# Patient Record
Sex: Female | Born: 1985 | Race: White | Hispanic: No | Marital: Married | Smoking: Former smoker
Health system: Southern US, Community
[De-identification: ages and names within clinical notes are randomized; demographics above are authoritative.]

## PROBLEM LIST (undated history)

## (undated) ENCOUNTER — Inpatient Hospital Stay (HOSPITAL_COMMUNITY): Payer: Self-pay

## (undated) DIAGNOSIS — T4145XA Adverse effect of unspecified anesthetic, initial encounter: Secondary | ICD-10-CM

## (undated) DIAGNOSIS — N8300 Follicular cyst of ovary, unspecified side: Secondary | ICD-10-CM

## (undated) DIAGNOSIS — N3020 Other chronic cystitis without hematuria: Secondary | ICD-10-CM

## (undated) DIAGNOSIS — N3010 Interstitial cystitis (chronic) without hematuria: Secondary | ICD-10-CM

## (undated) DIAGNOSIS — R87610 Atypical squamous cells of undetermined significance on cytologic smear of cervix (ASC-US): Secondary | ICD-10-CM

## (undated) DIAGNOSIS — IMO0002 Reserved for concepts with insufficient information to code with codable children: Secondary | ICD-10-CM

## (undated) DIAGNOSIS — F419 Anxiety disorder, unspecified: Secondary | ICD-10-CM

## (undated) DIAGNOSIS — M5136 Other intervertebral disc degeneration, lumbar region: Secondary | ICD-10-CM

## (undated) DIAGNOSIS — A630 Anogenital (venereal) warts: Secondary | ICD-10-CM

## (undated) DIAGNOSIS — Z8742 Personal history of other diseases of the female genital tract: Secondary | ICD-10-CM

## (undated) DIAGNOSIS — R000 Tachycardia, unspecified: Secondary | ICD-10-CM

## (undated) DIAGNOSIS — N870 Mild cervical dysplasia: Secondary | ICD-10-CM

## (undated) DIAGNOSIS — M51369 Other intervertebral disc degeneration, lumbar region without mention of lumbar back pain or lower extremity pain: Secondary | ICD-10-CM

## (undated) DIAGNOSIS — E282 Polycystic ovarian syndrome: Secondary | ICD-10-CM

## (undated) DIAGNOSIS — K219 Gastro-esophageal reflux disease without esophagitis: Secondary | ICD-10-CM

## (undated) DIAGNOSIS — T8859XA Other complications of anesthesia, initial encounter: Secondary | ICD-10-CM

## (undated) DIAGNOSIS — IMO0001 Reserved for inherently not codable concepts without codable children: Secondary | ICD-10-CM

## (undated) DIAGNOSIS — R87619 Unspecified abnormal cytological findings in specimens from cervix uteri: Secondary | ICD-10-CM

## (undated) DIAGNOSIS — N643 Galactorrhea not associated with childbirth: Secondary | ICD-10-CM

## (undated) HISTORY — DX: Polycystic ovarian syndrome: E28.2

## (undated) HISTORY — DX: Galactorrhea not associated with childbirth: N64.3

## (undated) HISTORY — DX: Other intervertebral disc degeneration, lumbar region: M51.36

## (undated) HISTORY — DX: Atypical squamous cells of undetermined significance on cytologic smear of cervix (ASC-US): R87.610

## (undated) HISTORY — PX: BLADDER AUGMENTATION: SHX1233

## (undated) HISTORY — DX: Other chronic cystitis without hematuria: N30.20

## (undated) HISTORY — DX: Personal history of other diseases of the female genital tract: Z87.42

## (undated) HISTORY — PX: ABDOMINAL HYSTERECTOMY: SHX81

## (undated) HISTORY — DX: Mild cervical dysplasia: N87.0

## (undated) HISTORY — DX: Unspecified abnormal cytological findings in specimens from cervix uteri: R87.619

## (undated) HISTORY — DX: Follicular cyst of ovary, unspecified side: N83.00

## (undated) HISTORY — PX: COLPOSCOPY: SHX161

## (undated) HISTORY — PX: MYRINGOTOMY: SUR874

## (undated) HISTORY — DX: Reserved for concepts with insufficient information to code with codable children: IMO0002

## (undated) HISTORY — DX: Other intervertebral disc degeneration, lumbar region without mention of lumbar back pain or lower extremity pain: M51.369

## (undated) HISTORY — PX: WISDOM TOOTH EXTRACTION: SHX21

## (undated) HISTORY — PX: CHOLECYSTECTOMY: SHX55

## (undated) HISTORY — DX: Anogenital (venereal) warts: A63.0

---
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
## 2005-04-13 ENCOUNTER — Inpatient Hospital Stay (HOSPITAL_COMMUNITY): Admission: AD | Admit: 2005-04-13 | Discharge: 2005-04-13 | Payer: Self-pay | Admitting: Obstetrics & Gynecology

## 2005-06-18 ENCOUNTER — Inpatient Hospital Stay (HOSPITAL_COMMUNITY): Admission: AD | Admit: 2005-06-18 | Discharge: 2005-06-18 | Payer: Self-pay | Admitting: Obstetrics and Gynecology

## 2005-06-19 ENCOUNTER — Inpatient Hospital Stay (HOSPITAL_COMMUNITY): Admission: AD | Admit: 2005-06-19 | Discharge: 2005-06-19 | Payer: Self-pay | Admitting: *Deleted

## 2005-08-20 ENCOUNTER — Inpatient Hospital Stay (HOSPITAL_COMMUNITY): Admission: AD | Admit: 2005-08-20 | Discharge: 2005-08-20 | Payer: Self-pay | Admitting: *Deleted

## 2005-08-23 ENCOUNTER — Inpatient Hospital Stay (HOSPITAL_COMMUNITY): Admission: AD | Admit: 2005-08-23 | Discharge: 2005-08-25 | Payer: Self-pay | Admitting: Obstetrics and Gynecology

## 2006-06-05 ENCOUNTER — Inpatient Hospital Stay (HOSPITAL_COMMUNITY): Admission: AD | Admit: 2006-06-05 | Discharge: 2006-06-06 | Payer: Self-pay | Admitting: *Deleted

## 2006-09-20 ENCOUNTER — Ambulatory Visit: Payer: Self-pay | Admitting: Internal Medicine

## 2006-09-20 DIAGNOSIS — N3010 Interstitial cystitis (chronic) without hematuria: Secondary | ICD-10-CM

## 2006-09-20 DIAGNOSIS — F17200 Nicotine dependence, unspecified, uncomplicated: Secondary | ICD-10-CM

## 2006-09-20 DIAGNOSIS — R519 Headache, unspecified: Secondary | ICD-10-CM | POA: Insufficient documentation

## 2006-09-20 DIAGNOSIS — Z9189 Other specified personal risk factors, not elsewhere classified: Secondary | ICD-10-CM | POA: Insufficient documentation

## 2006-09-20 DIAGNOSIS — R109 Unspecified abdominal pain: Secondary | ICD-10-CM | POA: Insufficient documentation

## 2006-09-20 DIAGNOSIS — R51 Headache: Secondary | ICD-10-CM | POA: Insufficient documentation

## 2006-09-20 LAB — CONVERTED CEMR LAB
AST: 16 units/L (ref 0–37)
Basophils Absolute: 0 10*3/uL (ref 0.0–0.1)
Bilirubin, Direct: 0.1 mg/dL (ref 0.0–0.3)
CO2: 29 meq/L (ref 19–32)
Calcium: 9.4 mg/dL (ref 8.4–10.5)
Eosinophils Absolute: 0.2 10*3/uL (ref 0.0–0.6)
GFR calc Af Amer: 162 mL/min
Glucose, Bld: 74 mg/dL (ref 70–99)
H Pylori IgG: NEGATIVE
Lymphocytes Relative: 25.2 % (ref 12.0–46.0)
MCV: 83.9 fL (ref 78.0–100.0)
Neutro Abs: 3.8 10*3/uL (ref 1.4–7.7)
Platelets: 252 10*3/uL (ref 150–400)
Potassium: 4.1 meq/L (ref 3.5–5.1)
RBC: 4.77 M/uL (ref 3.87–5.11)

## 2006-09-28 ENCOUNTER — Encounter: Payer: Self-pay | Admitting: Internal Medicine

## 2006-09-28 LAB — CONVERTED CEMR LAB: Tissue Transglutaminase Ab, IgA: 0.6 units (ref <7)

## 2006-10-04 ENCOUNTER — Encounter: Payer: Self-pay | Admitting: Internal Medicine

## 2007-05-18 ENCOUNTER — Ambulatory Visit (HOSPITAL_COMMUNITY): Admission: RE | Admit: 2007-05-18 | Discharge: 2007-05-18 | Payer: Self-pay | Admitting: Internal Medicine

## 2007-11-09 ENCOUNTER — Emergency Department (HOSPITAL_BASED_OUTPATIENT_CLINIC_OR_DEPARTMENT_OTHER): Admission: EM | Admit: 2007-11-09 | Discharge: 2007-11-09 | Payer: Self-pay | Admitting: Emergency Medicine

## 2009-11-21 ENCOUNTER — Emergency Department (HOSPITAL_BASED_OUTPATIENT_CLINIC_OR_DEPARTMENT_OTHER)
Admission: EM | Admit: 2009-11-21 | Discharge: 2009-11-21 | Payer: Self-pay | Source: Home / Self Care | Admitting: Emergency Medicine

## 2010-02-15 ENCOUNTER — Emergency Department (HOSPITAL_BASED_OUTPATIENT_CLINIC_OR_DEPARTMENT_OTHER)
Admission: EM | Admit: 2010-02-15 | Discharge: 2010-02-15 | Disposition: A | Discharge status: Home / Self Care | Payer: Federal, State, Local not specified - PPO | Attending: Emergency Medicine | Admitting: Emergency Medicine

## 2010-02-15 DIAGNOSIS — R599 Enlarged lymph nodes, unspecified: Secondary | ICD-10-CM | POA: Insufficient documentation

## 2010-02-15 DIAGNOSIS — R220 Localized swelling, mass and lump, head: Secondary | ICD-10-CM | POA: Insufficient documentation

## 2010-02-15 DIAGNOSIS — R221 Localized swelling, mass and lump, neck: Secondary | ICD-10-CM | POA: Insufficient documentation

## 2010-02-15 DIAGNOSIS — F17200 Nicotine dependence, unspecified, uncomplicated: Secondary | ICD-10-CM | POA: Insufficient documentation

## 2010-02-15 LAB — CBC: MCH: 29 pg (ref 26.0–34.0)

## 2010-02-15 LAB — BASIC METABOLIC PANEL
BUN: 7 mg/dL (ref 6–23)
Chloride: 106 mEq/L (ref 96–112)
Creatinine, Ser: 0.6 mg/dL (ref 0.4–1.2)
GFR calc non Af Amer: >60 mL/min (ref >60)
Glucose, Bld: 94 mg/dL (ref 70–99)
Potassium: 4.2 mEq/L (ref 3.5–5.1)

## 2010-02-15 LAB — DIFFERENTIAL
Basophils Absolute: 0 10*3/uL (ref 0.0–0.1)
Monocytes Absolute: 0.5 10*3/uL (ref 0.1–1.0)
Monocytes Relative: 6 % (ref 3–12)
Neutro Abs: 5 10*3/uL (ref 1.7–7.7)

## 2010-02-15 LAB — URINALYSIS, ROUTINE W REFLEX MICROSCOPIC
Bilirubin Urine: NEGATIVE
Ketones, ur: NEGATIVE mg/dL
Nitrite: NEGATIVE
Protein, ur: NEGATIVE mg/dL
Specific Gravity, Urine: 1.005 (ref 1.005–1.030)

## 2010-02-15 LAB — PREGNANCY, URINE: Preg Test, Ur: NEGATIVE

## 2010-05-23 NOTE — H&P (Signed)
NAMEKAMILLA, HANDS             ACCOUNT NO.:  [id number]   MEDICAL RECORD NO.:  [id number]          PATIENT TYPE:  INP   LOCATION:  9169                          FACILITY:  [facility]   PHYSICIAN:  Lenoard Aden, M.D.DATE OF BIRTH:  [birth year]   DATE OF ADMISSION:  08/23/2005  DATE OF DISCHARGE:                                HISTORY & PHYSICAL   CHIEF COMPLAINT:  Labor.   She is a 25 year old white female G1, P0, EDD is August 20, 2005, at 40-3/7  weeks, presents in active labor.  SHE HAS ALLERGY TO SULFA.   MEDICATIONS:  Prenatal vitamins.   She is a nonsmoker, nondrinker.  She denies domestic or physical violence.   MEDICAL HISTORY:  Remarkable for:  1. Asthma.  2. HPV.   FAMILY HISTORY:  1. Myocardial infarction.  2. Anemia,  3. Diabetes.  4. Thyroid disease.  5. Depression.   PREGNANCY COURSE:  Was complicated by preterm labor with negative  surveillance noted and normal ultrasounds by report.   PHYSICAL EXAM:  She is a well-developed, well-nourished white female in no  acute distress.  HEENT:  Normal.  LUNGS:  Clear.  HEART:  Rhythm regular.  ABDOMEN:  Soft, gravida, nontender.  Estimated fetal weight 7 pounds.  CERVIX:  Is 6-7 cm, 100%, vertex 0.  EXTREMITIES:  __________.  NEUROLOGIC EXAM:  Nonfocal.   IMPRESSION:  Term intrauterine pregnancy in active labor.   PLAN:  Proceed with admission and routine orders.      Lenoard Aden, M.D.  Electronically Signed     RJT/MEDQ  D:  08/23/2005  T:  08/23/2005  Job:  [id number]

## 2010-05-23 NOTE — Op Note (Signed)
[name], [name]             ACCOUNT NO.:  [id number]   MEDICAL RECORD NO.:  [id number]          PATIENT TYPE:  AMB   LOCATION:  [facility]                         FACILITY:  [facility]   PHYSICIAN:  Jamison Neighbor, M.D.  DATE OF BIRTH:  April 27, 1985   DATE OF PROCEDURE:  03/07/2004  DATE OF DISCHARGE:                                 OPERATIVE REPORT   PREOPERATIVE DIAGNOSIS:  Chronic pelvic pain, rule out interstitial  cystitis.   POSTOPERATIVE DIAGNOSIS:  Chronic pelvic pain, rule out interstitial  cystitis.   OPERATION/PROCEDURE:  1.  Cystoscopy.  2.  Urethral calibration.  3.  Hydrodistention of the bladder.  4.  Bladder biopsy with cauterization.  5.  Marcaine and Pyridium instillation, Marcaine and Kenalog injections.   SURGEON:  Jamison Neighbor, M.D.   ANESTHESIA:  General.   COMPLICATIONS:  None.   DRAINS:  None.   BRIEF HISTORY:  This 25 year old female has chronic pelvic pain.  The  etiology remains unclear but there is some suspicion that she might have  interstitial cystitis.  In addition, the patient does have some signs and  symptoms possibly consistent with endometriosis.  The patient's bladder  symptoms include urgency, frequency, dysuria, and chronic pelvic pain.  It  is certainly felt on the basis of preliminary evaluation that she might have  interstitial cystitis.  The patient is now to undergo cystoscopy, urethral  calibration and hydrodistention of the bladder with bladder biopsy if  indicated.  She will undergo Marcaine and Pyridium instillation and Marcaine  and Kenalog injection.  She understands the risks and benefits of the  procedure and gave full and informed consent.  This will be done  simultaneously with diagnostic laparoscopy by Dr. Delia Heady.   DESCRIPTION OF PROCEDURE:  After successful induction of general anesthesia,  the patient was placed in the dorsal lithotomy position, prepped with  Betadine and draped in the usual sterile fashion.   Careful bimanual  examination revealed a completely normal pelvis with no signs of cystocele,  rectocele or enterocele.  The uterus was palpably normal.  The urethra was  unremarkable.  The bladder appeared to be in good position.  The urethra was  of normal size accepting a 32-French ___________ urethral sound with no sign  of stenosis or stricture.  The cystoscope was inserted and the bladder was  carefully inspected.  It was free of any tumor or stones.  Both ureteral  orifices were normal in configuration and location.  The bladder was  distended at a pressure of 100 H2Ocm for five minutes when the bladder was  drained.  Glomerulations can be seen throughout the bladder.  The bladder  capacity was just under 900 mL which is slightly diminished compared to a  normal bladder capacity of 1150, but it is certainly better than the average  for interstitial cystitis which is 575 mL.  There was a terminal blood tinge  to the drain out.  This was felt to be consistent with a mild to moderate or  early interstitial cystitis.  A bladder biopsy will be sent for mass cell  analysis.  The biopsy site was cauterized as was a little bit of squamous  metaplasia at the base of the bladder.  The bladder  was drained.  A  Marcaine and Pyridium were left in the bladder.  Marcaine and Kenalog were  injected periurethrally.  The patient tolerated the procedure well and was  taken to the recovery room in good condition.   The patient did undergo diagnostic laparoscopy by Dr. Earlene Plater and who will  dictate that portion of the procedure.      RJE/MEDQ  D:  03/07/2004  T:  03/07/2004  Job:  [id number]

## 2010-05-23 NOTE — Op Note (Signed)
[name], [name]             ACCOUNT NO.:  [id number]   MEDICAL RECORD NO.:  [id number]          PATIENT TYPE:  AMB   LOCATION:  [facility]                         FACILITY:  [facility]   PHYSICIAN:  Ulen B. Earlene Plater, M.D.  DATE OF BIRTH:  [birth year]   DATE OF PROCEDURE:  03/07/2004  DATE OF DISCHARGE:                                 OPERATIVE REPORT   PREOPERATIVE DIAGNOSIS:  Pelvic pain, dysmenorrhea, suspected interstitial  cystitis.   POSTOPERATIVE DIAGNOSIS:  Pelvic pain, dysmenorrhea, suspected interstitial  cystitis.   PROCEDURE:  Open diagnostic laparoscopy following Dr. Logan Bores' cystoscopy.   SURGEON:  Chester Holstein. Earlene Plater, M.D.   ANESTHESIA:  General.   FINDINGS:  Normal-appearing pelvis, appendix, upper abdomen, tubes, and  ovaries.   BLOOD LOSS:  Less than 50.   COMPLICATIONS:  None.   SPECIMENS:  None.   INDICATIONS:  Patient with a history of pelvic pain, painful periods, and  irritative voiding symptoms.  Suspicion clinically for interstitial  cystitis.  Given the possibility for coexisting endometriosis, I recommended  diagnostic laparoscopy after Dr. Logan Bores did his cystoscopy.  We discussed the  risk of surgery, including bleeding, infection, damage to surrounding  organs.   After Dr. Logan Bores was complete with his procedure, the patient was reprepped  and draped.  The bladder had just been emptied at the end of Dr. Logan Bores'  cystoscopy.  The speculum inserted.  A single tooth attached to the anterior  lip of the cervix, and Hulka tenaculum attached.  A transverse incision was  made in the umbilicus, carried sharply to the fascia.  The fascia was  elevated and entered sharply.  The posterior sheath and peritoneum were  entered sharply with elevation of the fascia.  Purse-string suture of 0  Vicryl placed around the fascial defect.  A 5 mm trocar sleeve was inserted  without the blade with a blunt probe inserted.  This was secured with the  purse-string suture.   Pneumoperitoneum obtained with CO2 gas.  The  Trendelenburg position was obtained, and diagnostic laparoscopy 5 mm camera  was inserted.  The pelvis was inspected and appeared completely normal, as  did the upper abdomen; therefore, the diagnostic laparoscopy was terminated.   The scope was removed, and gas released.  The trocar was removed, and any  residual gas removed with elevation of the abdominal wall with an S  retractor.  The fascia defect was obliterated by tying the previously placed  purse-string suture.  The skin was closed with 4-0 Vicryl in a subcuticular  fashion.  The single-tooth tenaculum removed and cervix hemostatic.   The patient tolerated the procedure well.  There were no complications.  She  was taken to the recovery room awake and alert in a stable condition.      WBD/MEDQ  D:  03/07/2004  T:  03/07/2004  Job:  [id number]

## 2010-06-17 LAB — RPR: RPR: NONREACTIVE

## 2010-06-17 LAB — HIV ANTIBODY (ROUTINE TESTING W REFLEX): HIV: NONREACTIVE

## 2010-06-17 LAB — ABO/RH: RH Type: NEGATIVE

## 2010-06-20 ENCOUNTER — Emergency Department (HOSPITAL_BASED_OUTPATIENT_CLINIC_OR_DEPARTMENT_OTHER)
Admission: EM | Admit: 2010-06-20 | Discharge: 2010-06-21 | Disposition: A | Discharge status: Home / Self Care | Payer: Federal, State, Local not specified - PPO | Attending: Emergency Medicine | Admitting: Emergency Medicine

## 2010-06-20 DIAGNOSIS — R079 Chest pain, unspecified: Secondary | ICD-10-CM | POA: Insufficient documentation

## 2010-06-20 DIAGNOSIS — R0609 Other forms of dyspnea: Secondary | ICD-10-CM | POA: Insufficient documentation

## 2010-06-20 DIAGNOSIS — F17200 Nicotine dependence, unspecified, uncomplicated: Secondary | ICD-10-CM | POA: Insufficient documentation

## 2010-06-20 DIAGNOSIS — R110 Nausea: Secondary | ICD-10-CM | POA: Insufficient documentation

## 2010-06-20 DIAGNOSIS — L272 Dermatitis due to ingested food: Secondary | ICD-10-CM | POA: Insufficient documentation

## 2010-06-20 DIAGNOSIS — R0989 Other specified symptoms and signs involving the circulatory and respiratory systems: Secondary | ICD-10-CM | POA: Insufficient documentation

## 2010-09-15 ENCOUNTER — Emergency Department (HOSPITAL_BASED_OUTPATIENT_CLINIC_OR_DEPARTMENT_OTHER)
Admission: EM | Admit: 2010-09-15 | Discharge: 2010-09-15 | Discharge status: Against Medical Advice | Payer: Federal, State, Local not specified - PPO | Attending: Emergency Medicine | Admitting: Emergency Medicine

## 2010-09-15 DIAGNOSIS — J029 Acute pharyngitis, unspecified: Secondary | ICD-10-CM | POA: Insufficient documentation

## 2010-09-15 HISTORY — DX: Interstitial cystitis (chronic) without hematuria: N30.10

## 2010-09-15 NOTE — ED Notes (Signed)
C/o sorethroat x 23 days-[redacted] weeks pregnant

## 2010-10-07 LAB — URINALYSIS, ROUTINE W REFLEX MICROSCOPIC
Bilirubin Urine: NEGATIVE
Glucose, UA: NEGATIVE
Ketones, ur: 15 — AB
Nitrite: NEGATIVE
pH: 6

## 2010-10-07 LAB — COMPREHENSIVE METABOLIC PANEL
ALT: 8
Albumin: 4.8
Calcium: 9.6
GFR calc Af Amer: >60
GFR calc non Af Amer: >60
Sodium: 139
Total Protein: 7.9

## 2010-10-07 LAB — URINE MICROSCOPIC-ADD ON

## 2010-10-07 LAB — DIFFERENTIAL
Basophils Relative: 2 — ABNORMAL HIGH
Eosinophils Absolute: 0.1
Eosinophils Relative: 2
Monocytes Relative: 5

## 2010-10-07 LAB — CBC
HCT: 43.1
Hemoglobin: 14.8
Platelets: 212
RDW: 11.9

## 2011-01-06 NOTE — L&D Delivery Note (Signed)
Delivery Note At 1:35 PM a viable and healthy female was delivered via  (Presentation:LOA).  APGAR: 8, 9; weight pending.   Placenta status: spontaneous and intact with 3 vessel Cord:  with the following complications: none.  Cord pH: na  Anesthesia: Epidural  Episiotomy: none Lacerations: 1st degree Suture Repair: 3.0 vicryl rapide Est. Blood Loss (mL): 300  Mom to postpartum.  Baby to nursery-stable.  [name] J 03/09/2011, 1:45 PM

## 2011-01-10 ENCOUNTER — Encounter (HOSPITAL_COMMUNITY): Payer: Self-pay | Admitting: *Deleted

## 2011-01-10 ENCOUNTER — Inpatient Hospital Stay (HOSPITAL_COMMUNITY)
Admission: AD | Admit: 2011-01-10 | Discharge: 2011-01-10 | Disposition: A | Discharge status: Home / Self Care | Payer: Federal, State, Local not specified - PPO | Source: Ambulatory Visit | Attending: Obstetrics | Admitting: Obstetrics

## 2011-01-10 DIAGNOSIS — R109 Unspecified abdominal pain: Secondary | ICD-10-CM | POA: Insufficient documentation

## 2011-01-10 DIAGNOSIS — O4700 False labor before 37 completed weeks of gestation, unspecified trimester: Secondary | ICD-10-CM | POA: Insufficient documentation

## 2011-01-10 DIAGNOSIS — R319 Hematuria, unspecified: Secondary | ICD-10-CM | POA: Insufficient documentation

## 2011-01-10 HISTORY — DX: Interstitial cystitis (chronic) without hematuria: N30.10

## 2011-01-10 LAB — WET PREP, GENITAL: Clue Cells Wet Prep HPF POC: NONE SEEN

## 2011-01-10 LAB — URINALYSIS, ROUTINE W REFLEX MICROSCOPIC
Ketones, ur: NEGATIVE mg/dL
Nitrite: NEGATIVE
Protein, ur: NEGATIVE mg/dL
pH: 7 (ref 5.0–8.0)

## 2011-01-10 LAB — URINE MICROSCOPIC-ADD ON

## 2011-01-10 MED ORDER — NITROFURANTOIN MONOHYD MACRO 100 MG PO CAPS: 100.0000 mg | ORAL_CAPSULE | Freq: Two times a day (BID) | ORAL | Status: AC

## 2011-01-10 MED ORDER — RISAQUAD PO CAPS: 2.0000 | ORAL_CAPSULE | Freq: Every day | ORAL | Status: DC

## 2011-01-10 NOTE — ED Provider Notes (Signed)
History   [name] is a 26 y.o. female G68P1001 [redacted]w[redacted]d with c/o increased cramping and vaginal discharge this AM. PNC at [facility] since 1st trim. Pregnancy complicated by PTC, repeated fFN 2 weeks ago negative, cvx closed last visit. Reports good FM, VD described as white thin, denies VB / odor / itching. No IC in past 24 hrs.  Hx of PTC with first pregnancy but term SVD.   Chief Complaint  Patient presents with  . Vaginal Discharge  . Flank Pain  . Urinary Frequency     OB History    Grav Para Term Preterm Abortions TAB SAB Ect Mult Living   2 1 1  0 0 0 0 0 0 1      Past Medical History  Diagnosis Date  . Cystitis, interstitial   . Cystitides, interstitial, chronic     Past Surgical History  Procedure Date  . Bladder augmentation     No family history on file.  History  Substance Use Topics  . Smoking status: Former Games developer  . Smokeless tobacco: Not on file  . Alcohol Use: No    Allergies:  Allergies  Allergen Reactions  . Dilaudid (Hydromorphone Hcl) Palpitations  . Iodine Rash  . Sulfonamide Derivatives Rash    Prescriptions prior to admission  Medication Sig Dispense Refill  . acetaminophen (TYLENOL) 325 MG tablet Take 650 mg by mouth once. For pain        . alum & mag hydroxide-simeth (MAALOX/MYLANTA) 200-200-20 MG/5ML suspension Take 5 mLs by mouth as needed. For indigestion       . calcium carbonate (TUMS - DOSED IN MG ELEMENTAL CALCIUM) 500 MG chewable tablet Chew 2 tablets by mouth 2 (two) times daily as needed. For indigestion         ROS neg except as noted  Physical Exam    Filed Vitals:   01/10/11 1231  BP: 108/74  Pulse: 95  Temp: 98.6 F (37 C)  TempSrc: Oral  Resp: 18  Height: 5\' 3"  (1.6 m)  Weight: 70.308 kg (155 lb)    Physical Exam: General: NAD Heart: RRR, no murmurs Lungs: CTA b/l  Abd: Soft, NT,  Ext: no edema Neuro: DTRs normal    Membranes:intact Vaginal bleeding: none Speculum Exam: cvx appears closed, wet  prep and fFN collected Digital Cervical Exam: Dilatation exterior os FT, interior os closed   Effacement 0   FHR:  Baseline rate 140   Variability moderate  Accelerations present  Decelerations none Contractions: Frequency X 2 in 60 min, mild irritability in between   ED Course   Results for orders placed during the hospital encounter of 01/10/11 (from the past 24 hour(s))  URINALYSIS, ROUTINE W REFLEX MICROSCOPIC     Status: Abnormal   Collection Time   01/10/11 12:35 PM      Component Value Range   Color, Urine YELLOW  YELLOW    APPearance CLEAR  CLEAR    Specific Gravity, Urine <1.005 (*) 1.005 - 1.030    pH 7.0  5.0 - 8.0    Glucose, UA NEGATIVE  NEGATIVE (mg/dL)   Hgb urine dipstick TRACE (*) NEGATIVE    Bilirubin Urine NEGATIVE  NEGATIVE    Ketones, ur NEGATIVE  NEGATIVE (mg/dL)   Protein, ur NEGATIVE  NEGATIVE (mg/dL)   Urobilinogen, UA 0.2  0.0 - 1.0 (mg/dL)   Nitrite NEGATIVE  NEGATIVE    Leukocytes, UA LARGE (*) NEGATIVE   URINE MICROSCOPIC-ADD ON     Status:  Abnormal   Collection Time   01/10/11 12:35 PM      Component Value Range   Squamous Epithelial / LPF MANY (*) RARE    WBC, UA 11-20  <3 (WBC/hpf)   RBC / HPF 0-2  <3 (RBC/hpf)   Bacteria, UA MANY (*) RARE   WET PREP, GENITAL     Status: Abnormal   Collection Time   01/10/11  1:15 PM      Component Value Range   Yeast, Wet Prep NONE SEEN  NONE SEEN    Trich, Wet Prep NONE SEEN  NONE SEEN    Clue Cells, Wet Prep NONE SEEN  NONE SEEN    WBC, Wet Prep HPF POC MODERATE (*) NONE SEEN     A/P: IUP at 30w 5d Preterm contractions No progressive cervical change, FfN pending Hematuria, suspect UTI / cystitis, will start Macrobid 100 mg PO BID pending UCX D/c home w/ PTL precautions, call if ctx > 6 / hr after rest and hydration F/U with OV as scheduled.   Dr. Billy Coast updated  [facility] 01/10/2011 2:14 PM

## 2011-01-11 LAB — URINE CULTURE: Colony Count: 1000

## 2011-02-10 LAB — STREP B DNA PROBE: GBS: NEGATIVE

## 2011-02-24 ENCOUNTER — Telehealth (HOSPITAL_COMMUNITY): Payer: Self-pay | Admitting: *Deleted

## 2011-02-24 ENCOUNTER — Encounter (HOSPITAL_COMMUNITY): Payer: Self-pay | Admitting: *Deleted

## 2011-02-24 NOTE — Telephone Encounter (Signed)
Preadmission screen  

## 2011-02-26 ENCOUNTER — Other Ambulatory Visit: Payer: Self-pay | Admitting: Obstetrics and Gynecology

## 2011-02-28 ENCOUNTER — Encounter (HOSPITAL_COMMUNITY): Payer: Self-pay | Admitting: *Deleted

## 2011-02-28 ENCOUNTER — Inpatient Hospital Stay (HOSPITAL_COMMUNITY)
Admission: AD | Admit: 2011-02-28 | Discharge: 2011-02-28 | Disposition: A | Discharge status: Home Health | Payer: Self-pay | Source: Ambulatory Visit | Attending: Obstetrics and Gynecology | Admitting: Obstetrics and Gynecology

## 2011-02-28 DIAGNOSIS — O479 False labor, unspecified: Secondary | ICD-10-CM | POA: Insufficient documentation

## 2011-02-28 NOTE — ED Notes (Signed)
Pt given options of ambulating or going home.  States will go home and return when ctx stronger.

## 2011-03-09 ENCOUNTER — Encounter (HOSPITAL_COMMUNITY): Payer: Self-pay | Admitting: Anesthesiology

## 2011-03-09 ENCOUNTER — Inpatient Hospital Stay (HOSPITAL_COMMUNITY)
Admission: RE | Admit: 2011-03-09 | Discharge: 2011-03-10 | DRG: 373 | Disposition: A | Discharge status: Home / Self Care | Payer: Federal, State, Local not specified - PPO | Source: Ambulatory Visit | Attending: Obstetrics and Gynecology | Admitting: Obstetrics and Gynecology

## 2011-03-09 ENCOUNTER — Encounter (HOSPITAL_COMMUNITY): Payer: Self-pay

## 2011-03-09 ENCOUNTER — Inpatient Hospital Stay (HOSPITAL_COMMUNITY): Payer: Federal, State, Local not specified - PPO | Admitting: Anesthesiology

## 2011-03-09 DIAGNOSIS — IMO0001 Reserved for inherently not codable concepts without codable children: Secondary | ICD-10-CM

## 2011-03-09 HISTORY — DX: Reserved for inherently not codable concepts without codable children: IMO0001

## 2011-03-09 LAB — CBC
HCT: 35.6 % — ABNORMAL LOW (ref 36.0–46.0)
MCV: 86 fL (ref 78.0–100.0)
Platelets: 281 10*3/uL (ref 150–400)
RBC: 4.14 MIL/uL (ref 3.87–5.11)
WBC: 14 10*3/uL — ABNORMAL HIGH (ref 4.0–10.5)

## 2011-03-09 MED ORDER — ACETAMINOPHEN 325 MG PO TABS: 650.0000 mg | ORAL_TABLET | ORAL | Status: DC | PRN

## 2011-03-09 MED ORDER — SENNOSIDES-DOCUSATE SODIUM 8.6-50 MG PO TABS
2.0000 | ORAL_TABLET | Freq: Every day | ORAL | Status: DC
  Administered 2011-03-09: 2 via ORAL

## 2011-03-09 MED ORDER — DIBUCAINE 1 % RE OINT: 1.0000 "application " | TOPICAL_OINTMENT | RECTAL | Status: DC | PRN

## 2011-03-09 MED ORDER — IBUPROFEN 600 MG PO TABS
600.0000 mg | ORAL_TABLET | Freq: Four times a day (QID) | ORAL | Status: DC
  Administered 2011-03-09 – 2011-03-10 (×4): 600 mg via ORAL
  Filled 2011-03-09 (×4): qty 1

## 2011-03-09 MED ORDER — LACTATED RINGERS IV SOLN
500.0000 mL | Freq: Once | INTRAVENOUS | Status: AC
  Administered 2011-03-09: 1000 mL via INTRAVENOUS

## 2011-03-09 MED ORDER — DIPHENHYDRAMINE HCL 25 MG PO CAPS: 25.0000 mg | ORAL_CAPSULE | Freq: Four times a day (QID) | ORAL | Status: DC | PRN

## 2011-03-09 MED ORDER — METHYLERGONOVINE MALEATE 0.2 MG PO TABS: 0.2000 mg | ORAL_TABLET | ORAL | Status: DC | PRN

## 2011-03-09 MED ORDER — MISOPROSTOL 200 MCG PO TABS
ORAL_TABLET | ORAL | Status: AC
  Filled 2011-03-09: qty 5

## 2011-03-09 MED ORDER — OXYTOCIN 20 UNITS IN LACTATED RINGERS INFUSION - SIMPLE: 125.0000 mL/h | Freq: Once | INTRAVENOUS | Status: DC

## 2011-03-09 MED ORDER — TERBUTALINE SULFATE 1 MG/ML IJ SOLN: 0.2500 mg | Freq: Once | INTRAMUSCULAR | Status: DC | PRN

## 2011-03-09 MED ORDER — IBUPROFEN 600 MG PO TABS: 600.0000 mg | ORAL_TABLET | Freq: Four times a day (QID) | ORAL | Status: DC | PRN

## 2011-03-09 MED ORDER — BENZOCAINE-MENTHOL 20-0.5 % EX AERO
INHALATION_SPRAY | CUTANEOUS | Status: AC
  Administered 2011-03-10: 1 via TOPICAL
  Filled 2011-03-09: qty 56

## 2011-03-09 MED ORDER — LANOLIN HYDROUS EX OINT: TOPICAL_OINTMENT | CUTANEOUS | Status: DC | PRN

## 2011-03-09 MED ORDER — LACTATED RINGERS IV SOLN: 500.0000 mL | INTRAVENOUS | Status: DC | PRN

## 2011-03-09 MED ORDER — FLEET ENEMA 7-19 GM/118ML RE ENEM: 1.0000 | ENEMA | RECTAL | Status: DC | PRN

## 2011-03-09 MED ORDER — LACTATED RINGERS IV SOLN
INTRAVENOUS | Status: DC
  Administered 2011-03-09: 10:00:00 via INTRAVENOUS

## 2011-03-09 MED ORDER — BENZOCAINE-MENTHOL 20-0.5 % EX AERO
1.0000 "application " | INHALATION_SPRAY | CUTANEOUS | Status: DC | PRN
  Administered 2011-03-09 – 2011-03-10 (×2): 1 via TOPICAL

## 2011-03-09 MED ORDER — FENTANYL 2.5 MCG/ML BUPIVACAINE 1/10 % EPIDURAL INFUSION (WH - ANES)
14.0000 mL/h | INTRAMUSCULAR | Status: DC
  Administered 2011-03-09 (×2): 14 mL/h via EPIDURAL
  Filled 2011-03-09 (×2): qty 60

## 2011-03-09 MED ORDER — ONDANSETRON HCL 4 MG/2ML IJ SOLN: 4.0000 mg | INTRAMUSCULAR | Status: DC | PRN

## 2011-03-09 MED ORDER — LIDOCAINE HCL (PF) 1 % IJ SOLN
INTRAMUSCULAR | Status: DC | PRN
  Administered 2011-03-09 (×2): 5 mL

## 2011-03-09 MED ORDER — OXYTOCIN 10 UNIT/ML IJ SOLN
INTRAMUSCULAR | Status: AC
  Administered 2011-03-09: 10 [IU] via INTRAVENOUS
  Filled 2011-03-09: qty 2

## 2011-03-09 MED ORDER — OXYTOCIN 20 UNITS IN LACTATED RINGERS INFUSION - SIMPLE
1.0000 m[IU]/min | INTRAVENOUS | Status: DC
  Administered 2011-03-09: 2 m[IU]/min via INTRAVENOUS
  Filled 2011-03-09: qty 1000

## 2011-03-09 MED ORDER — PHENYLEPHRINE 40 MCG/ML (10ML) SYRINGE FOR IV PUSH (FOR BLOOD PRESSURE SUPPORT)
80.0000 ug | PREFILLED_SYRINGE | INTRAVENOUS | Status: DC | PRN
  Filled 2011-03-09: qty 5

## 2011-03-09 MED ORDER — METHYLERGONOVINE MALEATE 0.2 MG/ML IJ SOLN
INTRAMUSCULAR | Status: AC
  Filled 2011-03-09: qty 1

## 2011-03-09 MED ORDER — ZOLPIDEM TARTRATE 5 MG PO TABS: 5.0000 mg | ORAL_TABLET | Freq: Every evening | ORAL | Status: DC | PRN

## 2011-03-09 MED ORDER — EPHEDRINE 5 MG/ML INJ
10.0000 mg | INTRAVENOUS | Status: DC | PRN
  Filled 2011-03-09: qty 4

## 2011-03-09 MED ORDER — LIDOCAINE HCL (PF) 1 % IJ SOLN: 30.0000 mL | INTRAMUSCULAR | Status: DC | PRN

## 2011-03-09 MED ORDER — ONDANSETRON HCL 4 MG PO TABS: 4.0000 mg | ORAL_TABLET | ORAL | Status: DC | PRN

## 2011-03-09 MED ORDER — WITCH HAZEL-GLYCERIN EX PADS: 1.0000 "application " | MEDICATED_PAD | CUTANEOUS | Status: DC | PRN

## 2011-03-09 MED ORDER — OXYTOCIN BOLUS FROM INFUSION
500.0000 mL | Freq: Once | INTRAVENOUS | Status: DC
  Filled 2011-03-09: qty 500

## 2011-03-09 MED ORDER — ONDANSETRON HCL 4 MG/2ML IJ SOLN: 4.0000 mg | Freq: Four times a day (QID) | INTRAMUSCULAR | Status: DC | PRN

## 2011-03-09 MED ORDER — OXYCODONE-ACETAMINOPHEN 5-325 MG PO TABS: 1.0000 | ORAL_TABLET | ORAL | Status: DC | PRN

## 2011-03-09 MED ORDER — METHYLERGONOVINE MALEATE 0.2 MG/ML IJ SOLN: 0.2000 mg | INTRAMUSCULAR | Status: DC | PRN

## 2011-03-09 MED ORDER — DIPHENHYDRAMINE HCL 50 MG/ML IJ SOLN
12.5000 mg | INTRAMUSCULAR | Status: DC | PRN
  Filled 2011-03-09: qty 1

## 2011-03-09 MED ORDER — PHENYLEPHRINE 40 MCG/ML (10ML) SYRINGE FOR IV PUSH (FOR BLOOD PRESSURE SUPPORT): 80.0000 ug | PREFILLED_SYRINGE | INTRAVENOUS | Status: DC | PRN

## 2011-03-09 MED ORDER — EPHEDRINE 5 MG/ML INJ: 10.0000 mg | INTRAVENOUS | Status: DC | PRN

## 2011-03-09 MED ORDER — CITRIC ACID-SODIUM CITRATE 334-500 MG/5ML PO SOLN: 30.0000 mL | ORAL | Status: DC | PRN

## 2011-03-09 MED ORDER — PRENATAL MULTIVITAMIN CH
1.0000 | ORAL_TABLET | Freq: Every day | ORAL | Status: DC
  Administered 2011-03-10: 1 via ORAL
  Filled 2011-03-09: qty 1

## 2011-03-09 MED ORDER — OXYTOCIN 20 UNITS IN LACTATED RINGERS INFUSION - SIMPLE: 125.0000 mL/h | INTRAVENOUS | Status: DC

## 2011-03-09 MED ORDER — TETANUS-DIPHTH-ACELL PERTUSSIS 5-2.5-18.5 LF-MCG/0.5 IM SUSP
0.5000 mL | Freq: Once | INTRAMUSCULAR | Status: AC
  Administered 2011-03-10: 0.5 mL via INTRAMUSCULAR
  Filled 2011-03-09: qty 0.5

## 2011-03-09 MED ORDER — OXYTOCIN 10 UNIT/ML IJ SOLN
INTRAMUSCULAR | Status: AC
  Administered 2011-03-09: 30 [IU] via INTRAVENOUS
  Filled 2011-03-09: qty 2

## 2011-03-09 MED ORDER — SIMETHICONE 80 MG PO CHEW: 80.0000 mg | CHEWABLE_TABLET | ORAL | Status: DC | PRN

## 2011-03-09 NOTE — Progress Notes (Signed)
[name] is a 26 y.o. G2P1001 at [redacted]w[redacted]d by LMP admitted for induction of labor due to Elective at term.  Subjective: Comfortable  Objective: BP 116/69  Pulse 89  Temp(Src) 98.2 F (36.8 C) (Oral)  Resp 18  Ht 5\' 3"  (1.6 m)  Wt 74.39 kg (164 lb)  BMI 29.05 kg/m2      FHT:  FHR: 145 bpm, variability: moderate,  accelerations:  Present,  decelerations:  Absent UC:   irregular, every 5-7 minutes SVE:   3-4/80/-1 AROM- clear  Labs: Lab Results  Component Value Date   WBC 14.0* 03/09/2011   HGB 11.7* 03/09/2011   HCT 35.6* 03/09/2011   MCV 86.0 03/09/2011   PLT 281 03/09/2011    Assessment / Plan: Induction of labor due to term with favorable cervix,  progressing well on pitocin  Labor: Progressing normally Preeclampsia:  na Fetal Wellbeing:  Category I Pain Control:  Labor support without medications I/D:  n/a Anticipated MOD:  NSVD  Trinton Prewitt J 03/09/2011, 8:44 AM

## 2011-03-09 NOTE — H&P (Signed)
[name], [name]             ACCOUNT NO.:  [id number]  MEDICAL RECORD NO.:  [id number]  LOCATION:  9162                          FACILITY:  [facility]  PHYSICIAN:  Lenoard Aden, M.D.DATE OF BIRTH:  1985/11/27  DATE OF ADMISSION:  03/09/2011 DATE OF DISCHARGE:                             HISTORY & PHYSICAL   CHIEF COMPLAINT:  Induction at 39 weeks with favorable cervix, maternal discomfort, and musculoskeletal pain.  HISTORY OF PRESENT ILLNESS:  She is a 26 year old white female, G2, P1, at [redacted] weeks gestation with aforementioned indications for induction.  ALLERGIES:  SHELLFISH, MORPHINE, SULFA DRUGS.  MEDICATIONS:  Prenatal vitamins.  SOCIAL HISTORY:  She is a nonsmoker, nondrinker.  She denies domestic or physical violence.  PERSONAL HISTORY:  She has a personal history of chronic cystitis, galactorrhea, and dysmenorrhea.  FAMILY HISTORY:  Contributory for migraine headaches, heart disease, COPD, thyroid disease, depression, diabetes, previous history of 7 pounds 15 ounce fetus born in 2007.  SURGICAL HISTORY:  Remarkable for bladder surgery/diagnostic laparoscopy, and myringotomy.  PRENATAL COURSE:  Complicated by multiple episodes of preterm labor, previously on Procardia as a p.r.n. use medication for preterm labor. Her GBS was negative.  PHYSICAL EXAMINATION:  GENERAL:  She is a well-developed, well- nourished, white female in no acute distress.  HEENT:  Normal. NECK:  Supple.  Full range of motion. LUNGS:  Clear. HEART:  Regular rhythm. ABDOMEN:  Soft, gravid, nontender.  Estimated fetal weight 8 pounds. Cervix is 2-3 cm, 70% vertex, -1. EXTREMITIES:  There are no cords. NEUROLOGIC:  Nonfocal. SKIN:  Intact.  IMPRESSION:  A 39 week intrauterine pregnancy with musculoskeletal pain and favorable cervix for induction.  PLAN:  Proceed with Pitocin induction epidural as needed.  Anticipate attempts at vaginal delivery.     Lenoard Aden,  M.D.     RJT/MEDQ  D:  03/09/2011  T:  03/09/2011  Job:  [id number]

## 2011-03-09 NOTE — Anesthesia Procedure Notes (Signed)
Epidural Patient location during procedure: OB Start time: 03/09/2011 9:29 AM  Staffing Anesthesiologist: Brayton Caves R Performed by: anesthesiologist   Preanesthetic Checklist Completed: patient identified, site marked, surgical consent, pre-op evaluation, timeout performed, IV checked, risks and benefits discussed and monitors and equipment checked  Epidural Patient position: sitting Prep: site prepped and draped and DuraPrep Patient monitoring: continuous pulse ox and blood pressure Approach: midline Injection technique: LOR air and LOR saline  Needle:  Needle type: Tuohy  Needle gauge: 17 G Needle length: 9 cm Needle insertion depth: 5 cm cm Catheter type: closed end flexible Catheter size: 19 Gauge Catheter at skin depth: 10 cm Test dose: negative  Assessment Events: blood not aspirated, injection not painful, no injection resistance, negative IV test and no paresthesia  Additional Notes Patient identified.  Risk benefits discussed including failed block, incomplete pain control, headache, nerve damage, paralysis, blood pressure changes, nausea, vomiting, reactions to medication both toxic or allergic, and postpartum back pain.  Patient expressed understanding and wished to proceed.  All questions were answered.  Sterile technique used throughout procedure and epidural site dressed with sterile barrier dressing. No paresthesia or other complications noted.The patient did not experience any signs of intravascular injection such as tinnitus or metallic taste in mouth nor signs of intrathecal spread such as rapid motor block. Please see nursing notes for vital signs.  Note: initial prep with povidone iodine done, immediately washed with chlorhexidine... No rash no complications 15 minutes after

## 2011-03-09 NOTE — Anesthesia Preprocedure Evaluation (Signed)
Anesthesia Evaluation  Patient identified by MRN, date of birth, ID band Patient awake    Reviewed: Allergy & Precautions, H&P , Patient's Chart, lab work & pertinent test results  Airway Mallampati: II TM Distance: >3 FB Neck ROM: full    Dental No notable dental hx.    Pulmonary neg pulmonary ROS,  breath sounds clear to auscultation  Pulmonary exam normal       Cardiovascular negative cardio ROS  Rhythm:regular Rate:Normal     Neuro/Psych  Headaches, negative neurological ROS  negative psych ROS   GI/Hepatic negative GI ROS, Neg liver ROS,   Endo/Other  negative endocrine ROS  Renal/GU negative Renal ROS     Musculoskeletal   Abdominal   Peds  Hematology negative hematology ROS (+)   Anesthesia Other Findings   Reproductive/Obstetrics (+) Pregnancy                           Anesthesia Physical Anesthesia Plan  ASA: II  Anesthesia Plan: Epidural   Post-op Pain Management:    Induction:   Airway Management Planned:   Additional Equipment:   Intra-op Plan:   Post-operative Plan:   Informed Consent: I have reviewed the patients History and Physical, chart, labs and discussed the procedure including the risks, benefits and alternatives for the proposed anesthesia with the patient or authorized representative who has indicated his/her understanding and acceptance.     Plan Discussed with:   Anesthesia Plan Comments:         Anesthesia Quick Evaluation  

## 2011-03-10 LAB — CBC
HCT: 30.9 % — ABNORMAL LOW (ref 36.0–46.0)
Hemoglobin: 10.1 g/dL — ABNORMAL LOW (ref 12.0–15.0)
MCV: 86.6 fL (ref 78.0–100.0)
Platelets: 218 10*3/uL (ref 150–400)
RBC: 3.57 MIL/uL — ABNORMAL LOW (ref 3.87–5.11)
WBC: 13.2 10*3/uL — ABNORMAL HIGH (ref 4.0–10.5)

## 2011-03-10 MED ORDER — IBUPROFEN 800 MG PO TABS: 800.0000 mg | ORAL_TABLET | Freq: Three times a day (TID) | ORAL | Status: AC | PRN

## 2011-03-10 MED ORDER — RHO D IMMUNE GLOBULIN 1500 UNIT/2ML IJ SOLN
300.0000 ug | Freq: Once | INTRAMUSCULAR | Status: AC
  Administered 2011-03-10: 300 ug via INTRAMUSCULAR
  Filled 2011-03-10: qty 2

## 2011-03-10 MED ORDER — OXYCODONE-ACETAMINOPHEN 5-325 MG PO TABS: 1.0000 | ORAL_TABLET | ORAL | Status: AC | PRN

## 2011-03-10 MED ORDER — BENZOCAINE-MENTHOL 20-0.5 % EX AERO
INHALATION_SPRAY | CUTANEOUS | Status: AC
  Filled 2011-03-10: qty 56

## 2011-03-10 NOTE — Discharge Summary (Signed)
Obstetric Discharge Summary Reason for Admission: induction of labor - 39 week elective Prenatal Procedures: none Intrapartum Procedures: spontaneous vaginal delivery Postpartum Procedures: none Complications-Operative and Postpartum: 2nd  degree perineal laceration Hemoglobin  Date Value Range Status  03/10/2011 10.1* 12.0-15.0 (g/dL) Final     HCT  Date Value Range Status  03/10/2011 30.9* 36.0-46.0 (%) Final    Discharge Diagnoses: Term Pregnancy-delivered  Discharge Information: Date: 03/10/2011 Activity: pelvic rest Diet: routine Medications: PNV, Ibuprofen and Percocet Condition: stable Instructions: refer to practice specific booklet Discharge to: home Follow-up Information    Follow up with Lenoard Aden, MD. Schedule an appointment as soon as possible for a visit in 6 weeks.   Contact information:   [address] [place] [address] [phone]          Newborn Data: Live born female  Birth Weight: 6 lb 11.1 oz (3036 g) APGAR: 8, 9  Home with mother.  [name] 03/10/2011, 12:10 PM

## 2011-03-10 NOTE — Progress Notes (Signed)
Patient ID: [name], female   DOB: Jul 01, 1985, 26 y.o.   MRN: [id number]  PPD 1 SVD  S:  Reports feeling well - desires early discharge today             Tolerating po/ No nausea or vomiting             Bleeding is light             Pain controlled- motrin             Up ad lib / ambulatory  Newborn breast feeding  / Circumcision requested but not cleared by peds yet (no void)   O:  A & O x 3              VS: Blood pressure 104/69, pulse 84, temperature 97.7 F (36.5 C), temperature source Oral, resp. rate 18, height 5\' 3"  (1.6 m), weight 74.39 kg (164 lb), SpO2 98.00%, unknown if currently breastfeeding.  LABS: WBC/Hgb/Hct/Plts:  13.2/10.1/30.9/218 (03/05 0540)   Lungs: Clear and unlabored  Heart: regular rate and rhythm / no mumurs  Abdomen: soft, non-tender, non-distended              Fundus: firm, non-tender, U-1  Perineum: mild edema  Lochia: light  Extremities: no edema, no calf pain or tenderness    A: PPD # 1   Doing well - stable status  P:  Routine post partum orders  Discharge today of newborn cleared for discharge to home after 24 hours             Newborn circ if cleared by peds - nursery to contact MD if circ today  [name] 03/10/2011, 12:06 PM

## 2011-03-10 NOTE — Progress Notes (Signed)
UR Chart review completed.  

## 2011-03-10 NOTE — Anesthesia Postprocedure Evaluation (Signed)
Anesthesia Post Note  Patient: [name]  Procedure(s) Performed: * No procedures listed *  Anesthesia type: Epidural  Patient location: Mother/Baby  Post pain: Pain level controlled  Post assessment: Post-op Vital signs reviewed  Last Vitals:  Filed Vitals:   03/10/11 0529  BP: 104/69  Pulse: 84  Temp: 36.5 C  Resp: 18    Post vital signs: Reviewed  Level of consciousness: awake  Complications: No apparent anesthesia complications Site of epidural placement is sore.  Site is clean/dry/nonerythematous but tender.  She notes persistent numbness in her L thigh.  She was counseled on the likely resolution of both her soreness and numbness.  Her questions were answered.  She knows to follow up if she has any worsening of symptoms.

## 2011-03-11 LAB — RH IG WORKUP (INCLUDES ABO/RH)
Antibody Screen: NEGATIVE
Fetal Screen: NEGATIVE
Unit division: 0

## 2013-11-06 ENCOUNTER — Encounter (HOSPITAL_COMMUNITY): Payer: Self-pay

## 2014-08-29 ENCOUNTER — Emergency Department (HOSPITAL_BASED_OUTPATIENT_CLINIC_OR_DEPARTMENT_OTHER)
Admission: EM | Admit: 2014-08-29 | Discharge: 2014-08-29 | Disposition: A | Discharge status: Home / Self Care | Payer: BLUE CROSS/BLUE SHIELD | Attending: Emergency Medicine | Admitting: Emergency Medicine

## 2014-08-29 ENCOUNTER — Encounter (HOSPITAL_BASED_OUTPATIENT_CLINIC_OR_DEPARTMENT_OTHER): Payer: Self-pay | Admitting: *Deleted

## 2014-08-29 ENCOUNTER — Emergency Department (HOSPITAL_BASED_OUTPATIENT_CLINIC_OR_DEPARTMENT_OTHER): Payer: BLUE CROSS/BLUE SHIELD

## 2014-08-29 DIAGNOSIS — Y9289 Other specified places as the place of occurrence of the external cause: Secondary | ICD-10-CM | POA: Diagnosis not present

## 2014-08-29 DIAGNOSIS — S93401A Sprain of unspecified ligament of right ankle, initial encounter: Secondary | ICD-10-CM | POA: Diagnosis not present

## 2014-08-29 DIAGNOSIS — Z8619 Personal history of other infectious and parasitic diseases: Secondary | ICD-10-CM | POA: Insufficient documentation

## 2014-08-29 DIAGNOSIS — W19XXXA Unspecified fall, initial encounter: Secondary | ICD-10-CM

## 2014-08-29 DIAGNOSIS — Z87448 Personal history of other diseases of urinary system: Secondary | ICD-10-CM | POA: Diagnosis not present

## 2014-08-29 DIAGNOSIS — Z720 Tobacco use: Secondary | ICD-10-CM | POA: Insufficient documentation

## 2014-08-29 DIAGNOSIS — Y9301 Activity, walking, marching and hiking: Secondary | ICD-10-CM | POA: Insufficient documentation

## 2014-08-29 DIAGNOSIS — Y998 Other external cause status: Secondary | ICD-10-CM | POA: Insufficient documentation

## 2014-08-29 DIAGNOSIS — Z8742 Personal history of other diseases of the female genital tract: Secondary | ICD-10-CM | POA: Diagnosis not present

## 2014-08-29 DIAGNOSIS — W108XXA Fall (on) (from) other stairs and steps, initial encounter: Secondary | ICD-10-CM | POA: Insufficient documentation

## 2014-08-29 DIAGNOSIS — S99911A Unspecified injury of right ankle, initial encounter: Secondary | ICD-10-CM | POA: Diagnosis present

## 2014-08-29 MED ORDER — OXYCODONE-ACETAMINOPHEN 5-325 MG PO TABS: 1.0000 | ORAL_TABLET | ORAL | Status: DC | PRN

## 2014-08-29 MED ORDER — IBUPROFEN 800 MG PO TABS: 800.0000 mg | ORAL_TABLET | Freq: Four times a day (QID) | ORAL | Status: DC | PRN

## 2014-08-29 NOTE — ED Provider Notes (Signed)
CSN: [id number]     Arrival date & time 08/29/14  0807 History   First MD Initiated Contact with Patient 08/29/14 0815     Chief Complaint  Patient presents with  . Fall     (Consider location/radiation/quality/duration/timing/severity/associated sxs/prior Treatment) Patient is a 29 y.o. female presenting with fall and ankle pain.  Fall This is a new problem. The current episode started 1 to 2 hours ago. Episode frequency: once. The problem has not changed since onset.Pertinent negatives include no chest pain, no abdominal pain, no headaches and no shortness of breath. The symptoms are aggravated by walking. She has tried nothing for the symptoms.  Ankle Pain Location:  Ankle Injury: yes   Mechanism of injury: fall   Fall:    Fall occurred:  Down stairs   Height of fall:  5 steps Ankle location:  R ankle Pain details:    Quality:  Aching   Radiates to:  Does not radiate   Severity:  Moderate   Onset quality:  Sudden Chronicity:  New Worsened by:  Bearing weight, flexion, adduction, exercise, rotation, extension, activity and abduction Associated symptoms: swelling   Associated symptoms: no back pain, no fever and no numbness     Past Medical History  Diagnosis Date  . Cystitis, interstitial   . Cystitides, interstitial, chronic   . Abnormal Pap smear   . Mild dysplasia of cervix   . Papanicolaou smear of cervix with atypical squamous cells of undetermined significance (ASC-US)   . Other chronic cystitis   . Condyloma acuminatum   . Galactorrhea not associated with childbirth   . Follicular cyst of ovary   . History of endometritis   . Active labor - pitocin induction 03/09/2011   Past Surgical History  Procedure Laterality Date  . Bladder augmentation    . Laparoscopy  2006    exploration fallopian tubes  . Myringotomy    . Colposcopy     Family History  Problem Relation Age of Onset  . Anemia Mother   . Depression Father   . Migraines Maternal Grandmother   .  Heart attack Maternal Grandmother   . Diabetes Maternal Grandfather   . Heart disease Maternal Grandfather   . Diabetes Paternal Grandmother   . COPD Paternal Grandfather   . Autoimmune disease Paternal Grandfather     [name]'s Autoimmune Disease   Social History  Substance Use Topics  . Smoking status: Current Every Day Smoker    Last Attempt to Quit: 05/24/2010  . Smokeless tobacco: Never Used  . Alcohol Use: Yes     Comment: social   OB History    Gravida Para Term Preterm AB TAB SAB Ectopic Multiple Living   2 2 2  0 0 0 0 0 0 2     Review of Systems  Constitutional: Negative for fever.  Respiratory: Negative for shortness of breath.   Cardiovascular: Negative for chest pain.  Gastrointestinal: Negative for abdominal pain.  Musculoskeletal: Negative for back pain.  Neurological: Negative for headaches.  All other systems reviewed and are negative.     Allergies  Morphine and related; Dilaudid; Iodine; and Sulfonamide derivatives  Home Medications   Prior to Admission medications   Medication Sig Start Date End Date Taking? Authorizing Provider  Omeprazole (PRILOSEC PO) Take by mouth.   Yes Historical Provider, MD  oxyCODONE-acetaminophen (PERCOCET/ROXICET) 5-325 MG per tablet Take 1-2 tablets by mouth every 4 (four) hours as needed. 08/29/14   Debby Freiberg, MD   BP  129/76 mmHg  Pulse 100  Temp(Src) 98.6 F (37 C) (Oral)  Resp 16  SpO2 98%  LMP 08/27/2014 (Exact Date) Physical Exam  Constitutional: She is oriented to person, place, and time. She appears well-developed and well-nourished.  HENT:  Head: Normocephalic and atraumatic.  Right Ear: External ear normal.  Left Ear: External ear normal.  Eyes: Conjunctivae and EOM are normal. Pupils are equal, round, and reactive to light.  Neck: Normal range of motion. Neck supple.  Cardiovascular: Normal rate, regular rhythm, normal heart sounds and intact distal pulses.   Pulmonary/Chest: Effort normal  and breath sounds normal.  Abdominal: Soft. Bowel sounds are normal. There is no tenderness.  Musculoskeletal:       Right ankle: She exhibits decreased range of motion, swelling and ecchymosis. Tenderness. Lateral malleolus tenderness found.  Neurological: She is alert and oriented to person, place, and time.  Skin: Skin is warm and dry.  Vitals reviewed.   ED Course  Procedures (including critical care time) Labs Review Labs Reviewed - No data to display  Imaging Review Dg Ankle Complete Right  08/29/2014   CLINICAL DATA:  Acute right ankle pain after fall down steps this morning. Initial encounter.  EXAM: RIGHT ANKLE - COMPLETE 3+ VIEW  COMPARISON:  None.  FINDINGS: There is no evidence of fracture, dislocation, or joint effusion. There is no evidence of arthropathy or other focal bone abnormality. Soft tissues are unremarkable.  IMPRESSION: Normal right ankle.   Electronically Signed   By: Marijo Conception, M.D.   On: 08/29/2014 08:53   I have personally reviewed and evaluated these images and lab results as part of my medical decision-making.   EKG Interpretation None      MDM   Final diagnoses:  Fall, initial encounter  Ankle sprain, right, initial encounter    29 y.o. female with pertinent PMH of IC presents with R ankle pain after mechanical fall as above.  On arrival vitals and physical exam as above.  WU unremarkable.  Placed in splint and given crutches and ortho fu.  DC home in stable condition.    I have reviewed all laboratory and imaging studies if ordered as above  1. Fall, initial encounter   2. Ankle sprain, right, initial encounter         Debby Freiberg, MD 08/29/14 [phone]

## 2014-08-29 NOTE — Discharge Instructions (Signed)

## 2014-08-29 NOTE — ED Notes (Signed)
Patient and friend, a Psychologist, clinical, refused the ASO, stating that it would do more harm than good and that we should let the foot and ankle swell before placing it in the correct anatomical position.   Stated she would accept the crutches.

## 2014-08-29 NOTE — ED Notes (Addendum)
Patient states she was walking down the stairs, and her right ankle began to roll, which caused her to fall down five or six steps.  Denies loc. Neurovascular intact to right foot and ankle.

## 2014-08-29 NOTE — ED Notes (Signed)
Attempted to apply ASO.  Pt family angrily states, " I don't think the ASO is a good idea".  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 Barbaraann Barthel.  I think she needs to have crutches and follow up."   Referred to charge nurse.

## 2015-02-28 ENCOUNTER — Encounter (HOSPITAL_COMMUNITY): Payer: Self-pay

## 2015-03-07 ENCOUNTER — Other Ambulatory Visit: Payer: Self-pay | Admitting: Obstetrics and Gynecology

## 2015-04-04 NOTE — H&P (Signed)
NAMERAKIA, LEHEW NO.:  [id number]  MEDICAL RECORD NO.:  [id number]  LOCATION:  [facility]                         FACILITY:  [facility]  PHYSICIAN:  Lovenia Kim, M.D.DATE OF BIRTH:  1985-09-30  DATE OF ADMISSION:  02/22/2015 DATE OF DISCHARGE:                             HISTORY & PHYSICAL   CHIEF COMPLAINT:  Desire for elective sterilization.  HISTORY OF PRESENT ILLNESS:  A 30 year old white female, G2, P2, for elective sterilization.  MEDICATIONS:  Include Diflucan and Prilosec.  VAGINAL HISTORY:  Vaginal delivery x2.  SURGICAL HISTORY:  Laparoscopy, bladder stretching, and myringotomy.  FAMILY HISTORY:  Remarkable for thyroid disease, depression, migraine headaches, COPD, heart disease, and diabetes.  ALLERGIES:  Morphine derivatives, sulfa, and shellfish.  PHYSICAL EXAM:  GENERAL: Well-developed, well-nourished white female, in no acute distress. HEENT:  Normal. NECK:  Supple.  Full range of motion. LUNGS:  Clear. HEART:  Regular rate and rhythm. ABDOMEN:  Soft, nontender. PELVIC:  Exam reveals normal size uterus with no adnexal masses. EXTREMITIES:  There are no cords. NEUROLOGIC:  Nonfocal. SKIN:  Intact.  IMPRESSION:  Desire for elective sterilization.  PLAN:  Proceed with laparoscopic tubal ligation.  Risks of anesthesia, infection, bleeding, injury to surrounding organs, possible need for repair was discussed.  Delayed versus immediate complications to include bowel and bladder injury noted.  The patient acknowledges and wishes to proceed.     Lovenia Kim, M.D.     RJT/MEDQ  D:  04/04/2015  T:  04/04/2015  Job:  [id number]

## 2015-04-05 ENCOUNTER — Ambulatory Visit (HOSPITAL_COMMUNITY)
Admission: RE | Admit: 2015-04-05 | Discharge: 2015-04-05 | Disposition: A | Discharge status: Home / Self Care | Payer: BLUE CROSS/BLUE SHIELD | Source: Ambulatory Visit | Attending: Obstetrics and Gynecology | Admitting: Obstetrics and Gynecology

## 2015-04-05 ENCOUNTER — Ambulatory Visit (HOSPITAL_COMMUNITY): Payer: BLUE CROSS/BLUE SHIELD | Admitting: Anesthesiology

## 2015-04-05 ENCOUNTER — Encounter (HOSPITAL_COMMUNITY)
Admission: RE | Disposition: A | Discharge status: Home / Self Care | Payer: Self-pay | Source: Ambulatory Visit | Attending: Obstetrics and Gynecology

## 2015-04-05 ENCOUNTER — Encounter (HOSPITAL_COMMUNITY): Payer: Self-pay

## 2015-04-05 DIAGNOSIS — Z302 Encounter for sterilization: Secondary | ICD-10-CM | POA: Diagnosis not present

## 2015-04-05 DIAGNOSIS — F17200 Nicotine dependence, unspecified, uncomplicated: Secondary | ICD-10-CM | POA: Insufficient documentation

## 2015-04-05 DIAGNOSIS — Z885 Allergy status to narcotic agent status: Secondary | ICD-10-CM | POA: Diagnosis not present

## 2015-04-05 DIAGNOSIS — K219 Gastro-esophageal reflux disease without esophagitis: Secondary | ICD-10-CM | POA: Diagnosis not present

## 2015-04-05 DIAGNOSIS — Z91013 Allergy to seafood: Secondary | ICD-10-CM | POA: Diagnosis not present

## 2015-04-05 DIAGNOSIS — Z882 Allergy status to sulfonamides status: Secondary | ICD-10-CM | POA: Diagnosis not present

## 2015-04-05 DIAGNOSIS — F419 Anxiety disorder, unspecified: Secondary | ICD-10-CM | POA: Diagnosis not present

## 2015-04-05 HISTORY — PX: LAPAROSCOPIC TUBAL LIGATION: SHX1937

## 2015-04-05 HISTORY — DX: Anxiety disorder, unspecified: F41.9

## 2015-04-05 HISTORY — DX: Gastro-esophageal reflux disease without esophagitis: K21.9

## 2015-04-05 LAB — CBC
HCT: 41.5 % (ref 36.0–46.0)
Hemoglobin: 14.1 g/dL (ref 12.0–15.0)
MCH: 29.9 pg (ref 26.0–34.0)
MCHC: 34 g/dL (ref 30.0–36.0)
MCV: 88.1 fL (ref 78.0–100.0)
Platelets: 298 10*3/uL (ref 150–400)
RBC: 4.71 MIL/uL (ref 3.87–5.11)
RDW: 12.6 % (ref 11.5–15.5)
WBC: 8.4 10*3/uL (ref 4.0–10.5)

## 2015-04-05 LAB — HCG, SERUM, QUALITATIVE: Preg, Serum: NEGATIVE

## 2015-04-05 SURGERY — LIGATION, FALLOPIAN TUBE, LAPAROSCOPIC
Anesthesia: General | Site: Abdomen | Laterality: Bilateral

## 2015-04-05 MED ORDER — PROPOFOL 10 MG/ML IV BOLUS
INTRAVENOUS | Status: AC
  Filled 2015-04-05: qty 20

## 2015-04-05 MED ORDER — FENTANYL CITRATE (PF) 100 MCG/2ML IJ SOLN
INTRAMUSCULAR | Status: DC | PRN
  Administered 2015-04-05 (×4): 50 ug via INTRAVENOUS

## 2015-04-05 MED ORDER — HYDROCODONE-IBUPROFEN 7.5-200 MG PO TABS: 1.0000 | ORAL_TABLET | Freq: Three times a day (TID) | ORAL | Status: DC | PRN

## 2015-04-05 MED ORDER — ROCURONIUM BROMIDE 100 MG/10ML IV SOLN
INTRAVENOUS | Status: DC | PRN
  Administered 2015-04-05: 35 mg via INTRAVENOUS

## 2015-04-05 MED ORDER — METOCLOPRAMIDE HCL 5 MG/ML IJ SOLN: 10.0000 mg | Freq: Once | INTRAMUSCULAR | Status: DC | PRN

## 2015-04-05 MED ORDER — MIDAZOLAM HCL 2 MG/2ML IJ SOLN
INTRAMUSCULAR | Status: DC | PRN
  Administered 2015-04-05: 2 mg via INTRAVENOUS

## 2015-04-05 MED ORDER — ROCURONIUM BROMIDE 100 MG/10ML IV SOLN
INTRAVENOUS | Status: AC
  Filled 2015-04-05: qty 1

## 2015-04-05 MED ORDER — CEFAZOLIN SODIUM-DEXTROSE 2-3 GM-% IV SOLR
INTRAVENOUS | Status: AC
  Filled 2015-04-05: qty 50

## 2015-04-05 MED ORDER — BUPIVACAINE HCL (PF) 0.25 % IJ SOLN
INTRAMUSCULAR | Status: AC
  Filled 2015-04-05: qty 10

## 2015-04-05 MED ORDER — KETOROLAC TROMETHAMINE 30 MG/ML IJ SOLN
INTRAMUSCULAR | Status: AC
  Filled 2015-04-05: qty 1

## 2015-04-05 MED ORDER — ONDANSETRON HCL 4 MG/2ML IJ SOLN
INTRAMUSCULAR | Status: DC | PRN
  Administered 2015-04-05: 4 mg via INTRAVENOUS

## 2015-04-05 MED ORDER — KETOROLAC TROMETHAMINE 30 MG/ML IJ SOLN
INTRAMUSCULAR | Status: DC | PRN
  Administered 2015-04-05: 30 mg via INTRAVENOUS

## 2015-04-05 MED ORDER — MEPERIDINE HCL 25 MG/ML IJ SOLN: 6.2500 mg | INTRAMUSCULAR | Status: DC | PRN

## 2015-04-05 MED ORDER — CEFAZOLIN SODIUM-DEXTROSE 2-4 GM/100ML-% IV SOLN
2.0000 g | INTRAVENOUS | Status: AC
  Administered 2015-04-05: 2 g via INTRAVENOUS
  Filled 2015-04-05: qty 100

## 2015-04-05 MED ORDER — LIDOCAINE HCL (CARDIAC) 20 MG/ML IV SOLN
INTRAVENOUS | Status: AC
  Filled 2015-04-05: qty 5

## 2015-04-05 MED ORDER — LIDOCAINE HCL (CARDIAC) 20 MG/ML IV SOLN
INTRAVENOUS | Status: DC | PRN
  Administered 2015-04-05: 80 mg via INTRAVENOUS

## 2015-04-05 MED ORDER — FENTANYL CITRATE (PF) 250 MCG/5ML IJ SOLN
INTRAMUSCULAR | Status: AC
Start: 2015-04-05 — End: 2015-04-05
  Filled 2015-04-05: qty 5

## 2015-04-05 MED ORDER — PROPOFOL 10 MG/ML IV BOLUS
INTRAVENOUS | Status: DC | PRN
  Administered 2015-04-05: 200 mg via INTRAVENOUS

## 2015-04-05 MED ORDER — FENTANYL CITRATE (PF) 100 MCG/2ML IJ SOLN
INTRAMUSCULAR | Status: AC
  Administered 2015-04-05: 50 ug via INTRAVENOUS
  Filled 2015-04-05: qty 2

## 2015-04-05 MED ORDER — MIDAZOLAM HCL 2 MG/2ML IJ SOLN
INTRAMUSCULAR | Status: AC
  Filled 2015-04-05: qty 2

## 2015-04-05 MED ORDER — SODIUM CHLORIDE 0.9 % IJ SOLN
INTRAMUSCULAR | Status: AC
  Filled 2015-04-05: qty 10

## 2015-04-05 MED ORDER — ACETAMINOPHEN 500 MG PO TABS
1000.0000 mg | ORAL_TABLET | Freq: Once | ORAL | Status: AC
  Administered 2015-04-05: 1000 mg via ORAL

## 2015-04-05 MED ORDER — HYDROCODONE-ACETAMINOPHEN 7.5-325 MG PO TABS: 1.0000 | ORAL_TABLET | Freq: Once | ORAL | Status: DC | PRN

## 2015-04-05 MED ORDER — ACETAMINOPHEN 500 MG PO TABS
ORAL_TABLET | ORAL | Status: AC
  Filled 2015-04-05: qty 2

## 2015-04-05 MED ORDER — BUPIVACAINE HCL (PF) 0.25 % IJ SOLN
INTRAMUSCULAR | Status: DC | PRN
  Administered 2015-04-05: 10 mL

## 2015-04-05 MED ORDER — SCOPOLAMINE 1 MG/3DAYS TD PT72
MEDICATED_PATCH | TRANSDERMAL | Status: AC
  Administered 2015-04-05: 1.5 mg via TRANSDERMAL
  Filled 2015-04-05: qty 1

## 2015-04-05 MED ORDER — GLYCOPYRROLATE 0.2 MG/ML IJ SOLN
INTRAMUSCULAR | Status: AC
  Filled 2015-04-05: qty 3

## 2015-04-05 MED ORDER — SODIUM CHLORIDE 0.9 % IJ SOLN
INTRAMUSCULAR | Status: DC | PRN
  Administered 2015-04-05: 10 mL via INTRAVENOUS

## 2015-04-05 MED ORDER — NEOSTIGMINE METHYLSULFATE 10 MG/10ML IV SOLN
INTRAVENOUS | Status: DC | PRN
  Administered 2015-04-05: 3.5 mg via INTRAVENOUS

## 2015-04-05 MED ORDER — ONDANSETRON HCL 4 MG/2ML IJ SOLN
INTRAMUSCULAR | Status: AC
  Filled 2015-04-05: qty 2

## 2015-04-05 MED ORDER — DEXAMETHASONE SODIUM PHOSPHATE 10 MG/ML IJ SOLN
INTRAMUSCULAR | Status: DC | PRN
  Administered 2015-04-05: 10 mg via INTRAVENOUS

## 2015-04-05 MED ORDER — LACTATED RINGERS IV SOLN
INTRAVENOUS | Status: DC
  Administered 2015-04-05 (×4): via INTRAVENOUS

## 2015-04-05 MED ORDER — NEOSTIGMINE METHYLSULFATE 10 MG/10ML IV SOLN
INTRAVENOUS | Status: AC
  Filled 2015-04-05: qty 1

## 2015-04-05 MED ORDER — FENTANYL CITRATE (PF) 100 MCG/2ML IJ SOLN
25.0000 ug | INTRAMUSCULAR | Status: DC | PRN
  Administered 2015-04-05: 50 ug via INTRAVENOUS

## 2015-04-05 MED ORDER — GLYCOPYRROLATE 0.2 MG/ML IJ SOLN
INTRAMUSCULAR | Status: DC | PRN
  Administered 2015-04-05: 0.6 mg via INTRAVENOUS

## 2015-04-05 MED ORDER — SCOPOLAMINE 1 MG/3DAYS TD PT72
1.0000 | MEDICATED_PATCH | Freq: Once | TRANSDERMAL | Status: DC
Start: 2015-04-05 — End: 2015-04-05
  Administered 2015-04-05: 1.5 mg via TRANSDERMAL

## 2015-04-05 MED ORDER — DEXAMETHASONE SODIUM PHOSPHATE 10 MG/ML IJ SOLN
INTRAMUSCULAR | Status: AC
  Filled 2015-04-05: qty 1

## 2015-04-05 SURGICAL SUPPLY — 21 items
CATH ROBINSON RED A/P 16FR (CATHETERS) ×3 IMPLANT
CLOTH BEACON ORANGE TIMEOUT ST (SAFETY) ×3 IMPLANT
DRSG COVADERM PLUS 2X2 (GAUZE/BANDAGES/DRESSINGS) ×6 IMPLANT
DRSG OPSITE POSTOP 3X4 (GAUZE/BANDAGES/DRESSINGS) ×2 IMPLANT
GLOVE BIO SURGEON STRL SZ7.5 (GLOVE) ×3 IMPLANT
GLOVE BIOGEL PI IND STRL 7.0 (GLOVE) ×1 IMPLANT
GLOVE BIOGEL PI INDICATOR 7.0 (GLOVE) ×2
GOWN STRL REUS W/TWL LRG LVL3 (GOWN DISPOSABLE) ×6 IMPLANT
LIQUID BAND (GAUZE/BANDAGES/DRESSINGS) ×3 IMPLANT
NEEDLE INSUFFLATION 120MM (ENDOMECHANICALS) ×3 IMPLANT
PACK LAPAROSCOPY BASIN (CUSTOM PROCEDURE TRAY) ×3 IMPLANT
PAD TRENDELENBURG POSITION (MISCELLANEOUS) ×3 IMPLANT
SLEEVE XCEL OPT CAN 5 100 (ENDOMECHANICALS) ×1 IMPLANT
SOLUTION ELECTROLUBE (MISCELLANEOUS) ×3 IMPLANT
SUT VICRYL 0 UR6 27IN ABS (SUTURE) ×3 IMPLANT
SUT VICRYL 4-0 PS2 18IN ABS (SUTURE) ×2 IMPLANT
TOWEL OR 17X24 6PK STRL BLUE (TOWEL DISPOSABLE) ×6 IMPLANT
TROCAR OPTI TIP 5M 100M (ENDOMECHANICALS) ×1 IMPLANT
TROCAR XCEL DIL TIP R 11M (ENDOMECHANICALS) ×3 IMPLANT
WARMER LAPAROSCOPE (MISCELLANEOUS) ×3 IMPLANT
WATER STERILE IRR 1000ML POUR (IV SOLUTION) ×3 IMPLANT

## 2015-04-05 NOTE — Progress Notes (Signed)
Patient ID: [name], female   DOB: 1985-12-20, 30 y.o.   MRN: [id number] Patient seen and examined. Consent witnessed and signed. No changes noted. Update completed.

## 2015-04-05 NOTE — Anesthesia Postprocedure Evaluation (Signed)
Anesthesia Post Note  Patient: [name]  Procedure(s) Performed: Procedure(s) (LRB): LAPAROSCOPIC BILATERAL TUBAL LIGATION (Bilateral)  Patient location during evaluation: PACU Anesthesia Type: General Level of consciousness: awake and alert and oriented Pain management: pain level controlled Vital Signs Assessment: post-procedure vital signs reviewed and stable Respiratory status: spontaneous breathing, nonlabored ventilation and respiratory function stable Cardiovascular status: blood pressure returned to baseline and stable Postop Assessment: no signs of nausea or vomiting Anesthetic complications: no    Last Vitals:  Filed Vitals:   04/05/15 1600 04/05/15 1615  BP: 91/54 93/58  Pulse: 60 69  Temp:    Resp: 14 16    Last Pain:  Filed Vitals:   04/05/15 1620  PainSc: 4                  Tyteanna Ost A.

## 2015-04-05 NOTE — Anesthesia Preprocedure Evaluation (Signed)
Anesthesia Evaluation  Patient identified by MRN, date of birth, ID band Patient awake    Reviewed: Allergy & Precautions, NPO status , Patient's Chart, lab work & pertinent test results  Airway Mallampati: I  TM Distance: >3 FB Neck ROM: Full    Dental no notable dental hx. (+) Caps,    Pulmonary Current Smoker,    Pulmonary exam normal breath sounds clear to auscultation       Cardiovascular negative cardio ROS Normal cardiovascular exam Rhythm:Regular Rate:Normal     Neuro/Psych  Headaches, PSYCHIATRIC DISORDERS Anxiety    GI/Hepatic Neg liver ROS, GERD  Medicated and Controlled,  Endo/Other    Renal/GU negative Renal ROS Bladder dysfunction  Interstitial cystitis    Musculoskeletal negative musculoskeletal ROS (+)   Abdominal   Peds  Hematology negative hematology ROS (+)   Anesthesia Other Findings   Reproductive/Obstetrics                             Anesthesia Physical Anesthesia Plan  ASA: II  Anesthesia Plan: General   Post-op Pain Management:    Induction: Intravenous  Airway Management Planned: Oral ETT  Additional Equipment:   Intra-op Plan:   Post-operative Plan: Extubation in OR  Informed Consent: I have reviewed the patients History and Physical, chart, labs and discussed the procedure including the risks, benefits and alternatives for the proposed anesthesia with the patient or authorized representative who has indicated his/her understanding and acceptance.   Dental advisory given  Plan Discussed with: CRNA, Anesthesiologist and Surgeon  Anesthesia Plan Comments:         Anesthesia Quick Evaluation

## 2015-04-05 NOTE — Anesthesia Procedure Notes (Signed)
Procedure Name: Intubation Date/Time: 04/05/2015 2:13 PM Performed by: Georgeanne Nim Pre-anesthesia Checklist: Patient identified, Timeout performed, Emergency Drugs available, Suction available and Patient being monitored Patient Re-evaluated:Patient Re-evaluated prior to inductionOxygen Delivery Method: Circle system utilized Preoxygenation: Pre-oxygenation with 100% oxygen Intubation Type: IV induction Ventilation: Mask ventilation without difficulty Laryngoscope Size: Glidescope and 3 Number of attempts: 1 Airway Equipment and Method: Stylet and Video-laryngoscopy (per Dr. Royce Macadamia ) Placement Confirmation: ETT inserted through vocal cords under direct vision,  positive ETCO2,  CO2 detector and breath sounds checked- equal and bilateral Secured at: 20 cm Tube secured with: Tape Dental Injury: Teeth and Oropharynx as per pre-operative assessment

## 2015-04-05 NOTE — Discharge Instructions (Signed)

## 2015-04-05 NOTE — Transfer of Care (Signed)
Immediate Anesthesia Transfer of Care Note  Patient: [name]  Procedure(s) Performed: Procedure(s): LAPAROSCOPIC BILATERAL TUBAL LIGATION (Bilateral)  Patient Location: PACU  Anesthesia Type:General  Level of Consciousness: awake, alert , oriented and patient cooperative  Airway & Oxygen Therapy: Patient Spontanous Breathing and Patient connected to nasal cannula oxygen  Post-op Assessment: Report given to RN and Post -op Vital signs reviewed and stable  Post vital signs: Reviewed and stable  Last Vitals:  Filed Vitals:   04/05/15 1245  BP: 116/80  Pulse: 79  Temp: 36.6 C  Resp: 16    Complications: No apparent anesthesia complications

## 2015-04-05 NOTE — Op Note (Signed)
04/05/2015  2:47 PM  PATIENT:  Melrose Nakayama  30 y.o. female  PRE-OPERATIVE DIAGNOSIS:  Desires Sterilization  POST-OPERATIVE DIAGNOSIS:  Desires Sterilization  PROCEDURE:  Procedure(s): LAPAROSCOPIC BILATERAL TUBAL LIGATION  SURGEON:  Surgeon(s): Brien Few, MD  ASSISTANTS: none   ANESTHESIA:   local and general  ESTIMATED BLOOD LOSS: less than 50cc  DRAINS: none   LOCAL MEDICATIONS USED:  MARCAINE    and Amount: 10 ml  SPECIMEN:  No Specimen  DISPOSITION OF SPECIMEN:  PATHOLOGY  COUNTS:  YES  DICTATION #[id number]  PLAN OF CARE: dc home  PATIENT DISPOSITION:  PACU - hemodynamically stable.

## 2015-04-06 NOTE — Op Note (Signed)
[name], GRUENBERG NO.:  [id number]  MEDICAL RECORD NO.:  [id number]  LOCATION:  [facility]                          FACILITY:  [facility]  PHYSICIAN:  Lovenia Kim, M.D.DATE OF BIRTH:  [birth year]  DATE OF PROCEDURE: DATE OF DISCHARGE:  04/05/2015                              OPERATIVE REPORT   PREOPERATIVE DIAGNOSIS:  Desire for elective sterilization.  POSTOPERATIVE DIAGNOSIS:  Desire for elective sterilization.  PROCEDURE:  Laparoscopic tubal ligation.  SURGEON:  Lovenia Kim, MD  ASSISTANT:  None.  ANESTHESIA:  General and local.  ESTIMATED BLOOD LOSS:  Less than 50 mL.  COMPLICATIONS:  None.  DRAINS:  None.  COUNTS:  Correct.  DISPOSITION:  The patient to recovery room in good condition.  BRIEF OPERATIVE NOTE:  After being apprised of risks of anesthesia, infection, bleeding, injury to surrounding organs, possible need for repair, delayed versus immediate complications, which include bowel and bladder injury, possible need for repair, the patient brought to the operating room where she was administered a general anesthetic without complications.  Prepped and draped in usual sterile fashion. Catheterized to the bladder was empty.  Exam under anesthesia reveals an anteflexed uterus.  No adnexal masses.  Hulka tenaculum placed vaginally.  Infraumbilical incision made with a scalpel.  Veress needle placed with opening pressure of 2, 4 L of CO2 insufflated without difficulty.  Trocar entry atraumatic.  Visualization via camera reveals a normal trocar entry site.  No evidence of bowel injury.  Normal liver gallbladder area.  Normal appendiceal area.  Normal diaphragm.  The pelvis was visualized revealing a normal size uterus.  Bilateral normal tubes.  Normal ovaries.  Normal anterior and posterior cul-de-sac.  The Kleppinger bipolar cautery was entered and 3 contiguous areas of the ampullary-isthmic portion of the tube are cauterized using  bipolar cautery without difficulty.  The tubal lumens were then cauterized, portions of both tubes were cut using hook scissors and tubal lumens were visualized.  CO2 was released and good hemostasis was assured after reducing the intraabdominal pressure.  At this time, CO2 was completely released, positive pressure applied, trocar removed atraumatically. Deep suture was placed using 0 Vicryl suture in interrupted fashion.  Skin closed using 4-0 Vicryl.  Dilute Marcaine solution placed.  The patient tolerated the procedure well, was awakened, transferred to recovery after removal of the Hulka tenaculum without complication in good condition.     Lovenia Kim, M.D.     RJT/MEDQ  D:  04/05/2015  T:  04/06/2015  Job:  [id number]

## 2015-04-08 ENCOUNTER — Encounter (HOSPITAL_COMMUNITY): Payer: Self-pay | Admitting: Obstetrics and Gynecology

## 2015-11-03 ENCOUNTER — Encounter (HOSPITAL_COMMUNITY): Payer: Self-pay

## 2015-11-03 ENCOUNTER — Other Ambulatory Visit: Payer: Self-pay

## 2015-11-03 ENCOUNTER — Emergency Department (HOSPITAL_COMMUNITY)
Admission: EM | Admit: 2015-11-03 | Discharge: 2015-11-03 | Disposition: A | Discharge status: Home / Self Care | Payer: BLUE CROSS/BLUE SHIELD | Attending: Emergency Medicine | Admitting: Emergency Medicine

## 2015-11-03 ENCOUNTER — Emergency Department (HOSPITAL_COMMUNITY): Payer: BLUE CROSS/BLUE SHIELD

## 2015-11-03 DIAGNOSIS — F17210 Nicotine dependence, cigarettes, uncomplicated: Secondary | ICD-10-CM | POA: Diagnosis not present

## 2015-11-03 DIAGNOSIS — Z720 Tobacco use: Secondary | ICD-10-CM

## 2015-11-03 DIAGNOSIS — F419 Anxiety disorder, unspecified: Secondary | ICD-10-CM | POA: Diagnosis not present

## 2015-11-03 DIAGNOSIS — R002 Palpitations: Secondary | ICD-10-CM | POA: Diagnosis not present

## 2015-11-03 LAB — BASIC METABOLIC PANEL
Anion gap: 9 (ref 5–15)
BUN: 9 mg/dL (ref 6–20)
CHLORIDE: 105 mmol/L (ref 101–111)
CO2: 25 mmol/L (ref 22–32)
CREATININE: 0.72 mg/dL (ref 0.44–1.00)
Calcium: 9.5 mg/dL (ref 8.9–10.3)
GFR calc Af Amer: >60 mL/min (ref >60)
GFR calc non Af Amer: >60 mL/min (ref >60)
GLUCOSE: 102 mg/dL — AB (ref 65–99)
Potassium: 3.8 mmol/L (ref 3.5–5.1)
Sodium: 139 mmol/L (ref 135–145)

## 2015-11-03 LAB — CBC WITH DIFFERENTIAL/PLATELET
Basophils Absolute: 0 10*3/uL (ref 0.0–0.1)
Basophils Relative: 0 %
EOS ABS: 0.1 10*3/uL (ref 0.0–0.7)
Eosinophils Relative: 1 %
HEMATOCRIT: 43.7 % (ref 36.0–46.0)
HEMOGLOBIN: 14.5 g/dL (ref 12.0–15.0)
Lymphocytes Relative: 17 %
Lymphs Abs: 2.3 10*3/uL (ref 0.7–4.0)
MCH: 29.3 pg (ref 26.0–34.0)
MCHC: 33.2 g/dL (ref 30.0–36.0)
MCV: 88.3 fL (ref 78.0–100.0)
MONOS PCT: 5 %
Monocytes Absolute: 0.6 10*3/uL (ref 0.1–1.0)
NEUTROS ABS: 10.2 10*3/uL — AB (ref 1.7–7.7)
NEUTROS PCT: 77 %
Platelets: 256 10*3/uL (ref 150–400)
RBC: 4.95 MIL/uL (ref 3.87–5.11)
RDW: 12.6 % (ref 11.5–15.5)
WBC: 13.2 10*3/uL — ABNORMAL HIGH (ref 4.0–10.5)

## 2015-11-03 LAB — MAGNESIUM: Magnesium: 2.3 mg/dL (ref 1.7–2.4)

## 2015-11-03 LAB — I-STAT BETA HCG BLOOD, ED (MC, WL, AP ONLY): I-stat hCG, quantitative: <5 m[IU]/mL (ref <5)

## 2015-11-03 LAB — I-STAT TROPONIN, ED: Troponin i, poc: 0 ng/mL (ref 0.00–0.08)

## 2015-11-03 NOTE — ED Provider Notes (Signed)
[facility] DEPT Provider Note   CSN: [id number] Arrival date & time: 11/03/15  1702     History   Chief Complaint Chief Complaint  Patient presents with  . Anxiety/Palpitations    HPI [name] is a 30 y.o. female with a PMHx of anxiety, GERD, interstitial cystitis, and ASCUS, with a PSHx of tubal ligation, who presents to the ED sent here from [facility] urgent care for evaluation of palpitations. Patient states that she's had palpitations in the past, thought that it was due to anxiety, but today she was sitting on her couch when suddenly she felt her heart was racing, describes it as a fast regular rate, states that she developed generalized numbness and tingling everywhere, that her hands contracted up and she felt lightheaded and SOB due to the palpitations. No known aggravating or alleviating factors, no known triggers. Went to urgent care and they performed an EKG, thought they saw delta waves so they sent her here. Her heart rate there was 115 documented, but she states it got up to the 140s. She admits that she had a red bull earlier, but denies that caffeine intake seems to be associated with her palpitations. She states all of her symptoms resolved upon arrival here; HR in the 70-80s upon ED arrival. She has had thyroid studies recently in the last 2 years, and have always been normal. Has a family history of thyroid problems but no personal history of such. She admits to being a smoker. Denies any medical problems including diabetes, hypertension, or hyperlipidemia. Positive family history of MI in her maternal aunt, maternal grandparents, and paternal grandparents. No sudden cardiac death history in her family. Her mother states that she believes she had a blood clot in her legs, but was never on any anticoagulation, and only occurred once. Patient has never had any blood clots. Patient denies any fevers, chills, chest pain, ongoing shortness of breath, leg swelling, recent  travel/surgery/immobilization, history of cancer or PE/DVT, estrogen use, abdominal pain, nausea, vomiting, diarrhea, constipation, dysuria, hematuria, focal weakness, diaphoresis, claudication, or orthopnea. Denies any cough, hemoptysis, or unintentional weight changes. Works out at [employer] 5 days/week.   The history is provided by the patient and medical records. No language interpreter was used.  Palpitations   This is a recurrent problem. The current episode started 6 to 12 hours ago. The problem occurs every several days. The problem has been resolved. The problem is associated with anxiety. Associated symptoms include numbness (paresthesias generalized all over; resolved) and shortness of breath (only with palpitations; resolved). Pertinent negatives include no diaphoresis, no fever, no chest pain, no claudication, no orthopnea, no abdominal pain, no nausea, no vomiting, no lower extremity edema, no weakness, no cough and no hemoptysis. She has tried nothing for the symptoms. The treatment provided no relief. Risk factors include smoking/tobacco exposure. Her past medical history does not include heart disease or hyperthyroidism.    Past Medical History:  Diagnosis Date  . Abnormal Pap smear   . Active labor - pitocin induction 03/09/2011  . Anxiety   . Condyloma acuminatum   . Cystitides, interstitial, chronic   . Cystitis, interstitial   . Follicular cyst of ovary   . Galactorrhea not associated with childbirth   . GERD (gastroesophageal reflux disease)   . History of endometritis   . Mild dysplasia of cervix   . Other chronic cystitis   . Papanicolaou smear of cervix with atypical squamous cells of undetermined significance (ASC-US)  Patient Active Problem List   Diagnosis Date Noted  . Postpartum care following vaginal delivery (3/4) 03/10/2011  . TOBACCO USE 09/20/2006  . INTERSTITIAL CYSTITIS 09/20/2006  . HEADACHE 09/20/2006  . ABDOMINAL PAIN, RECURRENT 09/20/2006  .  CHICKENPOX, HX OF 09/20/2006    Past Surgical History:  Procedure Laterality Date  . BLADDER AUGMENTATION    . COLPOSCOPY    . LAPAROSCOPIC TUBAL LIGATION Bilateral 04/05/2015   Procedure: LAPAROSCOPIC BILATERAL TUBAL LIGATION;  Surgeon: Brien Few, MD;  Location: [facility];  Service: Gynecology;  Laterality: Bilateral;  . LAPAROSCOPY  2006   exploration fallopian tubes  . MYRINGOTOMY    . WISDOM TOOTH EXTRACTION      OB History    Gravida Para Term Preterm AB Living   2 2 2  0 0 2   SAB TAB Ectopic Multiple Live Births   0 0 0 0 2       Home Medications    Prior to Admission medications   Medication Sig Start Date End Date Taking? Authorizing Provider  HYDROcodone-ibuprofen (VICOPROFEN) 7.5-200 MG tablet Take 1 tablet by mouth every 8 (eight) hours as needed for moderate pain. 04/05/15   Brien Few, MD  omeprazole (PRILOSEC) 40 MG capsule Take 40 mg by mouth daily.    Historical Provider, MD    Family History Family History  Problem Relation Age of Onset  . Anemia Mother   . Depression Father   . Migraines Maternal Grandmother   . Heart attack Maternal Grandmother   . Diabetes Maternal Grandfather   . Heart disease Maternal Grandfather   . Diabetes Paternal Grandmother   . COPD Paternal Grandfather   . Autoimmune disease Paternal Grandfather     Howard Torti's Autoimmune Disease    Social History Social History  Substance Use Topics  . Smoking status: Current Every Day Smoker    Packs/day: 0.50    Years: 15.00    Types: Cigarettes  . Smokeless tobacco: Never Used  . Alcohol use Yes     Comment: social     Allergies   Morphine and related; Shellfish allergy; Dilaudid [hydromorphone hcl]; Iodine; and Sulfonamide derivatives   Review of Systems Review of Systems  Constitutional: Negative for chills, diaphoresis, fever and unexpected weight change.  Respiratory: Positive for shortness of breath (only with palpitations; resolved). Negative for cough  and hemoptysis.   Cardiovascular: Positive for palpitations. Negative for chest pain, orthopnea, claudication and leg swelling.  Gastrointestinal: Negative for abdominal pain, constipation, diarrhea, nausea and vomiting.  Genitourinary: Negative for dysuria and hematuria.  Musculoskeletal: Negative for arthralgias and myalgias.       +hands contracted up- resolved  Skin: Negative for color change.  Allergic/Immunologic: Negative for immunocompromised state.  Neurological: Positive for light-headedness (resolved) and numbness (paresthesias generalized all over; resolved). Negative for syncope and weakness.  Psychiatric/Behavioral: Negative for confusion. The patient is nervous/anxious.    10 Systems reviewed and are negative for acute change except as noted in the HPI.   Physical Exam Updated Vital Signs BP 109/67 (BP Location: Right Arm)   Pulse 78   Temp 98.1 F (36.7 C) (Oral)   Resp 20   SpO2 100%   Physical Exam  Constitutional: She is oriented to person, place, and time. Vital signs are normal. She appears well-developed and well-nourished.  Non-toxic appearance. No distress.  Afebrile, nontoxic, NAD  HENT:  Head: Normocephalic and atraumatic.  Mouth/Throat: Oropharynx is clear and moist and mucous membranes are normal.  Eyes: Conjunctivae and  EOM are normal. Right eye exhibits no discharge. Left eye exhibits no discharge.  Neck: Normal range of motion. Neck supple.  Cardiovascular: Normal rate, regular rhythm, normal heart sounds and intact distal pulses.  Exam reveals no gallop and no friction rub.   No murmur heard. RRR, nl s1/s2, no m/r/g, distal pulses intact, no pedal edema   Pulmonary/Chest: Effort normal and breath sounds normal. No respiratory distress. She has no decreased breath sounds. She has no wheezes. She has no rhonchi. She has no rales.  CTAB in all lung fields, no w/r/r, no hypoxia or increased WOB, speaking in full sentences, SpO2 100% on RA   Abdominal:  Soft. Normal appearance and bowel sounds are normal. She exhibits no distension. There is no tenderness. There is no rigidity, no rebound, no guarding, no CVA tenderness, no tenderness at McBurney's point and negative Murphy's sign.  Musculoskeletal: Normal range of motion.  MAE x4 Strength and sensation grossly intact Distal pulses intact Gait steady No pedal edema, neg homan's bilaterally   Neurological: She is alert and oriented to person, place, and time. She has normal strength. No sensory deficit.  Skin: Skin is warm, dry and intact. No rash noted.  Psychiatric: She has a normal mood and affect.  Nursing note and vitals reviewed.  19:46:34 Orthostatic Vital Signs DT  Orthostatic Lying   BP- Lying: 102/63  Pulse- Lying: 66      Orthostatic Sitting  BP- Sitting: 112/68  Pulse- Sitting: 78      Orthostatic Standing at 0 minutes  BP- Standing at 0 minutes: 110/79  Pulse- Standing at 0 minutes: 98 (inital HR went up to 120 when standing)     ED Treatments / Results  Labs (all labs ordered are listed, but only abnormal results are displayed) Labs Reviewed  CBC WITH DIFFERENTIAL/PLATELET - Abnormal; Notable for the following:       Result Value   WBC 13.2 (*)    Neutro Abs 10.2 (*)    All other components within normal limits  BASIC METABOLIC PANEL - Abnormal; Notable for the following:    Glucose, Bld 102 (*)    All other components within normal limits  MAGNESIUM  I-STAT TROPOININ, ED  I-STAT BETA HCG BLOOD, ED ([facility], [facility], [facility] ONLY)    EKG  EKG Interpretation  Date/Time:  Sunday November 03 2015 17:12:26 EDT Ventricular Rate:  81 PR Interval:  118 QRS Duration: 82 QT Interval:  374 QTC Calculation: 434 R Axis:   71 Text Interpretation:  Normal sinus rhythm Normal ECG Confirmed by KNOTT MD, DANIEL [id number]) on 11/03/2015 5:50:40 PM         Radiology Dg Chest 2 View  Result Date: 11/03/2015 CLINICAL DATA:  Palpitations, shortness of breath. EXAM: CHEST  2  VIEW COMPARISON:  None. FINDINGS: The heart size and mediastinal contours are within normal limits. Both lungs are clear. No pneumothorax or pleural effusion is noted. The visualized skeletal structures are unremarkable. IMPRESSION: No active cardiopulmonary disease. Electronically Signed   By: Marijo Conception, M.D.   On: 11/03/2015 19:19    Procedures Procedures (including critical care time)  Medications Ordered in ED Medications - No data to display   Initial Impression / Assessment and Plan / ED Course  I have reviewed the triage vital signs and the nursing notes.  Pertinent labs & imaging results that were available during my care of the patient were reviewed by me and considered in my medical decision making (see chart for  details).  Clinical Course   This patients CHA2DS2-VASc Score and unadjusted Ischemic Stroke Rate (% per year) is equal to 0.6 % stroke rate/year from a score of 1 due to being female.  Above score calculated as 1 point each if present [CHF, HTN, DM, Vascular=MI/PAD/Aortic Plaque, Age if 65-74, or Female] Above score calculated as 2 points each if present [Age > 75, or Stroke/TIA/TE]    30 y.o. female here with c/o palpitations intermittently for quite some time, sitting on the couch today when they occurred again; hands contracted, states she had paresthesias generalized everywhere, felt SOB due to the palpitations; currently completely resolved. States she has anxiety and thinks this may be playing a part. Seen at [facility] who thought they saw delta waves on EKG and sent her here; both my attending and I don't visualize any delta waves on the EKG. On arrival, HR 70-80s, NSR on monitor, EKG unremarkable, no longer having symptoms. Had red bull earlier, wonder whether caffeine is contributing to her symptoms. Had neg thyroid studies recently, doubt need to repeat those today. Pt's family very concerned, especially given her FHx of CAD/MI, therefore will get basic labs, Mg  level, trop, HCG, orthostatics, and CXR. No c/o CP or ongoing SOB, no longer tachycardic, no LE swelling, PERC neg, doubt PE as an etiology. Will reassess after labs. Discussed case with my attending Dr. Laneta Simmers who agrees with plan.   7:55 PM CBC w/diff with mild leukocytosis but differential WNL, could be hemoconcentration vs stress demargination. BMP WNL. Mg WNL. BetaHCG WNL. Trop neg. CXR unremarkable. Orthostatics remarkable for HR increasing to 120s when she stood up, mild dehydration could be contributing to her symptoms; discussed importance of hydration with water. CHADsVASC2 score 1 due to gender, but doubt need for anticoagulation/antiplatelet therapy, especially since no EKG findings of Afib. Discussed that she needs to f/up with cardiology for holter monitoring, maybe outpatient Echo. Smoking cessation encouraged. Stay hydrated. Pt without complaints at time of discharge, feels better, ready to go home.  I explained the diagnosis and have given explicit precautions to return to the ER including for any other new or worsening symptoms. The patient understands and accepts the medical plan as it's been dictated and I have answered their questions. Discharge instructions concerning home care and prescriptions have been given. The patient is STABLE and is discharged to home in good condition.   Final Clinical Impressions(s) / ED Diagnoses   Final diagnoses:  Palpitations  Anxiety  Tobacco user    New Prescriptions New Prescriptions   No medications on file     Zacarias Pontes, PA-C 11/03/15 2000    Leo Grosser, MD 11/04/15 1357

## 2015-11-03 NOTE — Discharge Instructions (Signed)
Your symptoms could have a variety of causes; your work up today is reassuring, but you may still need further outpatient work up such as a Holter Monitor. Stay well hydrated, avoid caffeine intake. STOP SMOKING! Follow up with the cardiologist in the next 1-2 weeks for further evaluation and management of your symptoms. Return to the ER for changes or worsening symptoms.

## 2015-11-03 NOTE — ED Triage Notes (Signed)
Patient arrived by EMS from [facility] physicians office after having palpitations starting at 2pm today. Patient has no documented hx of anxiety but thinks related to underlying anxiety. denies any known trigger. Alert and oriented, denies pain. Denies palpitations on arrival

## 2015-11-05 NOTE — Progress Notes (Signed)
CARDIOLOGY OFFICE NOTE  Date:  11/06/2015    [name] Date of Birth: November 19, 1985 Medical Record [id number]  PCP:  No PCP Per Patient  Cardiologist:  Radford Pax (NEW)   Chief Complaint  Patient presents with  . Palpitations    New patient visit - seen for Dr. Radford Pax (DOD)    History of Present Illness: [name] is a 30 y.o. female who presents today for a new patient visit for palpitations. Seen for Dr. Radford Pax (DOD).  No cardiac history noted.   Presented to the ER earlier this week with palpitations. Was sitting on her couch and suddenly felt her heart was racing - describes it as a fast regular rate, states that she developed generalized numbness and tingling everywhere, that her hands contracted up and she felt lightheaded and SOB due to the palpitations. No known aggravating or alleviating factors, no known triggers. Went to urgent care and they performed an EKG, thought they saw delta waves so they sent her here. Her heart rate there was 115 documented, but she states it got up to the 140s. She admits that she had a red bull earlier, but denies that caffeine intake seems to be associated with her palpitations. She states all of her symptoms resolved upon arrival here; HR in the 70-80s upon ED arrival. She has had thyroid studies recently in the last 2 years, and have always been normal. Has a family history of thyroid problems but no personal history of such. She admits to being a smoker. Denies any medical problems including diabetes, hypertension, or hyperlipidemia. Positive family history of MI in her maternal aunt, maternal grandparents, and paternal grandparents. No sudden cardiac death history in her family.   ER evaluation was basically normal - Orthostatics remarkable for HR increasing to 120s when she stood up.   Advised to follow up with PCP - referred here for possible echo and Holter.   Comes in today. Here alone. Feels fine today. Has had some  intermittent palpitations - describes as heart racing. 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 She notes that she has had spells of fast heart beating for at least a year and a half. It typically comes on with rest. Will last 30 to 60 minutes. Happens at least weekly, sometimes even daily. Has stopped all caffeine. Was told in the past this was anxiety - she does not feel anxious. Heart disease runs in her family - but no real rhythm issues that she is aware of. She is very active - goes to the gym at least 5 times a week - has no spells then.   Past Medical History:  Diagnosis Date  . Abnormal Pap smear   . Active labor - pitocin induction 03/09/2011  . Anxiety   . Condyloma acuminatum   . Cystitides, interstitial, chronic   . Cystitis, interstitial   . Follicular cyst of ovary   . Galactorrhea not associated with childbirth   . GERD (gastroesophageal reflux disease)   . History of endometritis   . Mild dysplasia of cervix   . Other chronic cystitis   . Papanicolaou smear of cervix with atypical squamous cells of undetermined significance (ASC-US)     Past Surgical History:  Procedure Laterality Date  . BLADDER AUGMENTATION    . COLPOSCOPY    . LAPAROSCOPIC TUBAL LIGATION Bilateral 04/05/2015   Procedure: LAPAROSCOPIC BILATERAL TUBAL LIGATION;  Surgeon: Brien Few, MD;  Location: [facility];  Service: Gynecology;  Laterality: Bilateral;  . LAPAROSCOPY  2006   exploration fallopian tubes  . MYRINGOTOMY    . WISDOM TOOTH EXTRACTION       Medications: Current Outpatient Prescriptions  Medication Sig Dispense Refill  . omeprazole (PRILOSEC) 40 MG capsule Take 40 mg by mouth daily.     No current facility-administered medications for this visit.     Allergies: Allergies  Allergen Reactions  . Morphine And Related Anaphylaxis    Pt says she can take percocet w/o a problem  . Shellfish Allergy Rash  . Dilaudid [Hydromorphone Hcl] Palpitations  . Iodine Rash  .  Sulfonamide Derivatives Rash    Social History: The patient  reports that she has been smoking Cigarettes.  She has a 7.50 pack-year smoking history. She uses smokeless tobacco. She reports that she drinks alcohol. She reports that she does not use drugs.   Family History: The patient's family history includes Anemia in her mother; Autoimmune disease in her paternal grandfather; COPD in her paternal grandfather; Depression in her father; Diabetes in her maternal grandfather and paternal grandmother; Heart attack in her maternal grandmother; Heart disease in her maternal grandfather; Migraines in her maternal grandmother.   Review of Systems: Please see the history of present illness.   Otherwise, the review of systems is positive for none.   All other systems are reviewed and negative.   Physical Exam: VS:  BP 112/78   Pulse 84   Ht 5\' 3"  (1.6 m)   Wt 147 lb 12.8 oz (67 kg)   SpO2 97% Comment: at rest  BMI 26.18 kg/m  .  BMI Body mass index is 26.18 kg/m.  Wt Readings from Last 3 Encounters:  11/06/15 147 lb 12.8 oz (67 kg)  04/05/15 146 lb (66.2 kg)  03/09/11 164 lb (74.4 kg)    General: Pleasant. Well developed, well nourished and in no acute distress.   HEENT: Normal.  Neck: Supple, no JVD, carotid bruits, or masses noted.  Cardiac: Regular rate and rhythm. No murmurs, rubs, or gallops. No edema.  Respiratory:  Lungs are clear to auscultation bilaterally with normal work of breathing.  GI: Soft and nontender.  MS: No deformity or atrophy. Gait and ROM intact.  Skin: Warm and dry. Color is normal.  Neuro:  Strength and sensation are intact and no gross focal deficits noted.  Psych: Alert, appropriate and with normal affect.   LABORATORY DATA:  EKG:  EKG is not ordered today. EKG from 11/03/15 with NSR. EKG from Urgent Care with NSR. Short PR noted on both. No noted delta wave. Reviewed with Dr. Radford Pax (DOD).    Lab Results  Component Value Date   WBC 13.2 (H)  11/03/2015   HGB 14.5 11/03/2015   HCT 43.7 11/03/2015   PLT 256 11/03/2015   GLUCOSE 102 (H) 11/03/2015   ALT 8 11/09/2007   AST 22 11/09/2007   NA 139 11/03/2015   K 3.8 11/03/2015   CL 105 11/03/2015   CREATININE 0.72 11/03/2015   BUN 9 11/03/2015   CO2 25 11/03/2015   TSH 0.64 09/20/2006    BNP (last 3 results) No results for input(s): BNP in the last 8760 hours.  ProBNP (last 3 results) No results for input(s): PROBNP in the last 8760 hours.   Other Studies Reviewed Today:   Assessment/Plan: 1. Palpitations - with symptomatic dyspnea - long standing - short PR on EKG - no delta waves - reviewed with Dr. Radford Pax - will arrange for event monitor  and Echo. Restrict caffeine. Check TSH today.  2. Elevated WBC - will recheck.   Current medicines are reviewed with the patient today.  The patient does not have concerns regarding medicines other than what has been noted above.  The following changes have been made:  See above.  Labs/ tests ordered today include:    Orders Placed This Encounter  Procedures  . CBC with Differential/Platelet  . TSH  . Cardiac event monitor  . ECHOCARDIOGRAM COMPLETE     Disposition:   Further disposition pending based on how her studies turn out.   Patient is agreeable to this plan and will call if any problems develop in the interim.   Signed: Burtis Junes, RN, ANP-C 11/06/2015 9:06 AM  [facility] [address] [place], [place]  [address] Phone: [phone] Fax: [phone]

## 2015-11-06 ENCOUNTER — Ambulatory Visit (INDEPENDENT_AMBULATORY_CARE_PROVIDER_SITE_OTHER): Payer: BLUE CROSS/BLUE SHIELD | Admitting: Nurse Practitioner

## 2015-11-06 ENCOUNTER — Encounter: Payer: Self-pay | Admitting: Nurse Practitioner

## 2015-11-06 VITALS — BP 112/78 | HR 84 | Ht 63.0 in | Wt 147.8 lb

## 2015-11-06 DIAGNOSIS — D72828 Other elevated white blood cell count: Secondary | ICD-10-CM

## 2015-11-06 DIAGNOSIS — R000 Tachycardia, unspecified: Secondary | ICD-10-CM

## 2015-11-06 DIAGNOSIS — R002 Palpitations: Secondary | ICD-10-CM | POA: Diagnosis not present

## 2015-11-06 DIAGNOSIS — R0600 Dyspnea, unspecified: Secondary | ICD-10-CM | POA: Diagnosis not present

## 2015-11-06 LAB — CBC WITH DIFFERENTIAL/PLATELET
Basophils Absolute: 0 cells/uL (ref 0–200)
Basophils Relative: 0 %
Eosinophils Absolute: 154 cells/uL (ref 15–500)
Eosinophils Relative: 2 %
HCT: 41.8 % (ref 35.0–45.0)
Hemoglobin: 14.2 g/dL (ref 11.7–15.5)
Lymphocytes Relative: 25 %
Lymphs Abs: 1925 cells/uL (ref 850–3900)
MCH: 30.1 pg (ref 27.0–33.0)
MCHC: 34 g/dL (ref 32.0–36.0)
MCV: 88.7 fL (ref 80.0–100.0)
MPV: 10 fL (ref 7.5–12.5)
Monocytes Absolute: 385 cells/uL (ref 200–950)
Monocytes Relative: 5 %
Neutro Abs: 5236 cells/uL (ref 1500–7800)
Neutrophils Relative %: 68 %
Platelets: 247 10*3/uL (ref 140–400)
RBC: 4.71 MIL/uL (ref 3.80–5.10)
RDW: 12.7 % (ref 11.0–15.0)
WBC: 7.7 10*3/uL (ref 3.8–10.8)

## 2015-11-06 LAB — TSH: TSH: 1.31 mIU/L

## 2015-11-06 NOTE — Patient Instructions (Addendum)
We will be checking the following labs today - CBC and TSH   Medication Instructions:    Continue with your current medicines.     Testing/Procedures To Be Arranged:  Event monitor   Echocardiogram  Follow-Up:   Will see how your studies turn out and then decide about follow up.      Other Special Instructions:   Continue to limit all of your caffeine.     If you need a refill on your cardiac medications before your next appointment, please call your pharmacy.   Call the [facility] office at [phone] if you have any questions, problems or concerns.

## 2015-11-07 ENCOUNTER — Telehealth: Payer: Self-pay | Admitting: Nurse Practitioner

## 2015-11-07 NOTE — Telephone Encounter (Signed)
Patient wanted to see if she could get a sooner appointment for her heart monitor. Informed patient that 11/14/15 is the best we can do right now, that the schedule is full. Informed patient that Truitt Merle NP would be informed for further advisement if needed. Patient verbalized understanding.

## 2015-11-07 NOTE — Telephone Encounter (Signed)
Patient states she saw Truitt Merle, NP yesterday and a holter monitor was ordered.  The appointment is not scheduled until later in the month.  She states she had 2 episodes last night where her heart rate reached 130 and over.   Please call

## 2015-11-08 NOTE — Telephone Encounter (Signed)
Danielle,  Can you talk to [facility] and see if we can get this moved up by any chance?

## 2015-11-08 NOTE — Telephone Encounter (Signed)
S/w pt and Abram Sander, monitor tech, stated does not have any openings but is Valetta Fuller is aware and has pt's mrn number in case anyone cancels. Pt stated appreciation.

## 2015-11-14 ENCOUNTER — Ambulatory Visit (INDEPENDENT_AMBULATORY_CARE_PROVIDER_SITE_OTHER): Payer: BLUE CROSS/BLUE SHIELD

## 2015-11-14 DIAGNOSIS — R002 Palpitations: Secondary | ICD-10-CM | POA: Diagnosis not present

## 2015-11-14 DIAGNOSIS — R000 Tachycardia, unspecified: Secondary | ICD-10-CM

## 2015-11-14 DIAGNOSIS — D72828 Other elevated white blood cell count: Secondary | ICD-10-CM | POA: Diagnosis not present

## 2015-11-14 DIAGNOSIS — R0600 Dyspnea, unspecified: Secondary | ICD-10-CM

## 2015-11-26 ENCOUNTER — Ambulatory Visit (HOSPITAL_COMMUNITY): Payer: BLUE CROSS/BLUE SHIELD | Attending: Cardiovascular Disease

## 2015-11-26 ENCOUNTER — Other Ambulatory Visit: Payer: Self-pay

## 2015-11-26 DIAGNOSIS — R0600 Dyspnea, unspecified: Secondary | ICD-10-CM

## 2015-11-26 DIAGNOSIS — R002 Palpitations: Secondary | ICD-10-CM

## 2015-11-26 DIAGNOSIS — R000 Tachycardia, unspecified: Secondary | ICD-10-CM

## 2015-11-26 DIAGNOSIS — D72828 Other elevated white blood cell count: Secondary | ICD-10-CM | POA: Diagnosis not present

## 2015-12-18 ENCOUNTER — Telehealth: Payer: Self-pay | Admitting: Nurse Practitioner

## 2015-12-18 NOTE — Telephone Encounter (Signed)
Will offer EP consult if needed in the future.

## 2015-12-18 NOTE — Telephone Encounter (Signed)
S/w pt and advise what Truitt Merle, NP discussed. Echo was normal and heart monitor was normal except for a couple bouts of tachy.  Pt was given the option to get appointment with Dr. Curt Bears.  Pt stated at this time everything was ok, does not want to go on any medication right now and does not want to seek further treatment at this time.  Will leave option open if pt decides later in time.  Will route to [place] to [facility].

## 2015-12-18 NOTE — Telephone Encounter (Signed)
Pt saw mychart results for holter monitor and would like a nurse call to explain results--pls call [phone]

## 2016-03-24 ENCOUNTER — Other Ambulatory Visit: Payer: Self-pay | Admitting: Obstetrics and Gynecology

## 2016-03-25 NOTE — Patient Instructions (Addendum)
Your procedure is scheduled on: Friday, April 10, 2016  Enter through the [employer] [facility] at:  7:00 AM  Pick up the phone at the desk and dial [phone].  Call this number if you have problems the morning of surgery: [phone].  Remember: Do NOT eat food or drink after:  Midnight Thursday, April 09, 2016  Take these medicines the morning of surgery with a SIP OF WATER:  Valium if needed  Stop ALL herbal medications and Ibuprofen at this time  Do NOT smoke the day of surgery.  Do NOT wear jewelry (body piercing), metal hair clips/bobby pins, make-up, or nail polish. Do NOT wear lotions, powders, or perfumes.  You may wear deodorant. Do NOT shave for 48 hours prior to surgery. Do NOT bring valuables to the hospital. Contacts, dentures, or bridgework may not be worn into surgery.  Leave suitcase in car.  After surgery it may be brought to your room.  For patients admitted to the hospital, checkout time is 11:00 AM the day of discharge.  Bring a copy of your healthcare power of attorney and living will documents.

## 2016-03-27 ENCOUNTER — Encounter (HOSPITAL_COMMUNITY): Payer: Self-pay

## 2016-03-27 ENCOUNTER — Encounter (HOSPITAL_COMMUNITY)
Admission: RE | Admit: 2016-03-27 | Discharge: 2016-03-27 | Disposition: A | Discharge status: Home / Self Care | Payer: BLUE CROSS/BLUE SHIELD | Source: Ambulatory Visit | Attending: Obstetrics and Gynecology | Admitting: Obstetrics and Gynecology

## 2016-03-27 DIAGNOSIS — Z01810 Encounter for preprocedural cardiovascular examination: Secondary | ICD-10-CM | POA: Diagnosis not present

## 2016-03-27 HISTORY — DX: Tachycardia, unspecified: R00.0

## 2016-03-27 HISTORY — DX: Other complications of anesthesia, initial encounter: T88.59XA

## 2016-03-27 HISTORY — DX: Adverse effect of unspecified anesthetic, initial encounter: T41.45XA

## 2016-03-27 LAB — BASIC METABOLIC PANEL
Anion gap: 4 — ABNORMAL LOW (ref 5–15)
BUN: 7 mg/dL (ref 6–20)
CHLORIDE: 105 mmol/L (ref 101–111)
CO2: 31 mmol/L (ref 22–32)
CREATININE: 0.69 mg/dL (ref 0.44–1.00)
Calcium: 9.8 mg/dL (ref 8.9–10.3)
GFR calc Af Amer: >60 mL/min (ref >60)
GFR calc non Af Amer: >60 mL/min (ref >60)
Glucose, Bld: 92 mg/dL (ref 65–99)
Potassium: 4.3 mmol/L (ref 3.5–5.1)
Sodium: 140 mmol/L (ref 135–145)

## 2016-03-27 LAB — CBC
HCT: 45.3 % (ref 36.0–46.0)
Hemoglobin: 15.3 g/dL — ABNORMAL HIGH (ref 12.0–15.0)
MCH: 30.2 pg (ref 26.0–34.0)
MCHC: 33.8 g/dL (ref 30.0–36.0)
MCV: 89.3 fL (ref 78.0–100.0)
PLATELETS: 285 10*3/uL (ref 150–400)
RBC: 5.07 MIL/uL (ref 3.87–5.11)
RDW: 13 % (ref 11.5–15.5)
WBC: 8.4 10*3/uL (ref 4.0–10.5)

## 2016-03-27 LAB — TYPE AND SCREEN
ABO/RH(D): A NEG
ANTIBODY SCREEN: NEGATIVE

## 2016-04-09 NOTE — H&P (Signed)
NAMEANNESSA, SATRE NO.:  [id number]  MEDICAL RECORD NO.:  [id number]  LOCATION:  PERIO                         FACILITY:  [place]  PHYSICIAN:  Lovenia Kim, M.D.DATE OF BIRTH:  [birth year]  DATE OF ADMISSION:  03/17/2016 DATE OF DISCHARGE:                             HISTORY & PHYSICAL   PREOPERATIVE DIAGNOSES:  Dysmenorrhea, menorrhagia, abnormal Pap smear, for definitive therapy.  HISTORY OF PRESENT ILLNESS:  31 year old white female, G2, P2, with known history of endometriosis and dysmenorrhea who presents now for definitive therapy.  She has a history of tubal ligation, history of laparoscopy with ablation of endometriosis.  She also reports questionable symptomatic mild cystocele without urinary incontinence.  MEDICATIONS:  Valium p.r.n., Prilosec.  ALLERGIES:  She has allergies to Dilaudid, morphine, shellfish, and sulfa.  SOCIAL HISTORY:  She is a smoker, nondrinker.  Denies domestic physical violence.  History of vaginal delivery x2.  PHYSICAL EXAMINATION:  GENERAL:  Well-developed, well-nourished white female in no acute distress. HEENT:  Normal. NECK:  Supple.  Full range of motion. LUNGS:  Clear. HEART:  Regular rate and rhythm. ABDOMEN:  Soft, nontender. PELVIC:  Reveals a tender bulky uterus and no adnexal masses. EXTREMITIES:  There are no cords. NEUROLOGIC:  Nonfocal. SKIN:  Intact.  IMPRESSION: 1. Refractory dysmenorrhea and menorrhagia. 2. History of tubal ligation. 3. History of severe dysplasia on Pap smear.  PLAN:  Proceed with da Vinci assisted total laparoscopic hysterectomy, bilateral salpingectomy.  Risks of anesthesia, infection, bleeding, injury to surrounding organs, possible need for repair discussed, delayed versus immediate complications to include bowel and bladder injury noted.  The patient acknowledges and we will proceed.     Lovenia Kim, M.D.     RJT/MEDQ  D:  04/09/2016  T:  04/09/2016  Job:   [id number]

## 2016-04-10 ENCOUNTER — Encounter (HOSPITAL_COMMUNITY): Payer: Self-pay

## 2016-04-10 ENCOUNTER — Encounter (HOSPITAL_COMMUNITY)
Admission: RE | Disposition: A | Discharge status: Home / Self Care | Payer: Self-pay | Source: Ambulatory Visit | Attending: Obstetrics and Gynecology

## 2016-04-10 ENCOUNTER — Ambulatory Visit (HOSPITAL_COMMUNITY): Payer: BLUE CROSS/BLUE SHIELD | Admitting: Registered Nurse

## 2016-04-10 ENCOUNTER — Ambulatory Visit (HOSPITAL_COMMUNITY)
Admission: RE | Admit: 2016-04-10 | Discharge: 2016-04-11 | Disposition: A | Discharge status: Home / Self Care | Payer: BLUE CROSS/BLUE SHIELD | Source: Ambulatory Visit | Attending: Obstetrics and Gynecology | Admitting: Obstetrics and Gynecology

## 2016-04-10 DIAGNOSIS — N920 Excessive and frequent menstruation with regular cycle: Secondary | ICD-10-CM | POA: Insufficient documentation

## 2016-04-10 DIAGNOSIS — Z882 Allergy status to sulfonamides status: Secondary | ICD-10-CM | POA: Diagnosis not present

## 2016-04-10 DIAGNOSIS — K219 Gastro-esophageal reflux disease without esophagitis: Secondary | ICD-10-CM | POA: Insufficient documentation

## 2016-04-10 DIAGNOSIS — F17210 Nicotine dependence, cigarettes, uncomplicated: Secondary | ICD-10-CM | POA: Diagnosis not present

## 2016-04-10 DIAGNOSIS — F419 Anxiety disorder, unspecified: Secondary | ICD-10-CM | POA: Insufficient documentation

## 2016-04-10 DIAGNOSIS — N946 Dysmenorrhea, unspecified: Secondary | ICD-10-CM | POA: Diagnosis not present

## 2016-04-10 DIAGNOSIS — N803 Endometriosis of pelvic peritoneum: Secondary | ICD-10-CM | POA: Diagnosis not present

## 2016-04-10 DIAGNOSIS — Z9851 Tubal ligation status: Secondary | ICD-10-CM | POA: Diagnosis not present

## 2016-04-10 DIAGNOSIS — C530 Malignant neoplasm of endocervix: Secondary | ICD-10-CM | POA: Diagnosis not present

## 2016-04-10 DIAGNOSIS — Z91013 Allergy to seafood: Secondary | ICD-10-CM | POA: Diagnosis not present

## 2016-04-10 DIAGNOSIS — Z885 Allergy status to narcotic agent status: Secondary | ICD-10-CM | POA: Diagnosis not present

## 2016-04-10 DIAGNOSIS — N736 Female pelvic peritoneal adhesions (postinfective): Secondary | ICD-10-CM | POA: Diagnosis not present

## 2016-04-10 HISTORY — PX: ROBOTIC ASSISTED TOTAL HYSTERECTOMY WITH SALPINGECTOMY: SHX6679

## 2016-04-10 LAB — HCG, SERUM, QUALITATIVE: PREG SERUM: NEGATIVE

## 2016-04-10 SURGERY — ROBOTIC ASSISTED TOTAL HYSTERECTOMY WITH SALPINGECTOMY
Anesthesia: General | Site: Abdomen | Laterality: Bilateral

## 2016-04-10 MED ORDER — ROPIVACAINE HCL 5 MG/ML IJ SOLN
INTRAMUSCULAR | Status: AC
  Filled 2016-04-10: qty 30

## 2016-04-10 MED ORDER — LIDOCAINE HCL 1 % IJ SOLN
INTRAMUSCULAR | Status: AC
  Filled 2016-04-10: qty 20

## 2016-04-10 MED ORDER — DEXAMETHASONE SODIUM PHOSPHATE 10 MG/ML IJ SOLN
INTRAMUSCULAR | Status: DC | PRN
  Administered 2016-04-10: 10 mg via INTRAVENOUS

## 2016-04-10 MED ORDER — PROPOFOL 10 MG/ML IV BOLUS
INTRAVENOUS | Status: AC
  Filled 2016-04-10: qty 20

## 2016-04-10 MED ORDER — DIPHENHYDRAMINE HCL 50 MG/ML IJ SOLN
12.5000 mg | Freq: Four times a day (QID) | INTRAMUSCULAR | Status: DC | PRN
  Administered 2016-04-10: 12.5 mg via INTRAVENOUS
  Filled 2016-04-10: qty 1

## 2016-04-10 MED ORDER — OXYCODONE-ACETAMINOPHEN 5-325 MG PO TABS
1.0000 | ORAL_TABLET | ORAL | Status: DC | PRN
  Administered 2016-04-10 – 2016-04-11 (×5): 2 via ORAL
  Filled 2016-04-10 (×5): qty 2

## 2016-04-10 MED ORDER — FENTANYL CITRATE (PF) 100 MCG/2ML IJ SOLN
INTRAMUSCULAR | Status: DC | PRN
  Administered 2016-04-10 (×2): 50 ug via INTRAVENOUS
  Administered 2016-04-10: 150 ug via INTRAVENOUS

## 2016-04-10 MED ORDER — KETOROLAC TROMETHAMINE 30 MG/ML IJ SOLN
INTRAMUSCULAR | Status: AC
  Filled 2016-04-10: qty 1

## 2016-04-10 MED ORDER — ONDANSETRON HCL 4 MG/2ML IJ SOLN
INTRAMUSCULAR | Status: AC
  Filled 2016-04-10: qty 2

## 2016-04-10 MED ORDER — ONDANSETRON HCL 4 MG/2ML IJ SOLN
INTRAMUSCULAR | Status: DC | PRN
  Administered 2016-04-10: 4 mg via INTRAVENOUS

## 2016-04-10 MED ORDER — SCOPOLAMINE 1 MG/3DAYS TD PT72
MEDICATED_PATCH | TRANSDERMAL | Status: AC
  Administered 2016-04-10: 1.5 mg via TRANSDERMAL
  Filled 2016-04-10: qty 1

## 2016-04-10 MED ORDER — SODIUM CHLORIDE 0.9 % IV SOLN
INTRAVENOUS | Status: DC | PRN
  Administered 2016-04-10: 60 mL

## 2016-04-10 MED ORDER — SCOPOLAMINE 1 MG/3DAYS TD PT72
1.0000 | MEDICATED_PATCH | Freq: Once | TRANSDERMAL | Status: DC
  Administered 2016-04-10: 1.5 mg via TRANSDERMAL

## 2016-04-10 MED ORDER — PROPOFOL 10 MG/ML IV BOLUS
INTRAVENOUS | Status: DC | PRN
  Administered 2016-04-10: 200 mg via INTRAVENOUS

## 2016-04-10 MED ORDER — KETOROLAC TROMETHAMINE 30 MG/ML IJ SOLN
INTRAMUSCULAR | Status: DC | PRN
  Administered 2016-04-10: 30 mg via INTRAVENOUS

## 2016-04-10 MED ORDER — MIDAZOLAM HCL 5 MG/5ML IJ SOLN
INTRAMUSCULAR | Status: DC | PRN
  Administered 2016-04-10: 2 mg via INTRAVENOUS

## 2016-04-10 MED ORDER — MIDAZOLAM HCL 2 MG/2ML IJ SOLN
INTRAMUSCULAR | Status: AC
  Filled 2016-04-10: qty 2

## 2016-04-10 MED ORDER — HYDROMORPHONE 1 MG/ML IV SOLN
INTRAVENOUS | Status: DC
  Administered 2016-04-10: 3.6 mg via INTRAVENOUS
  Administered 2016-04-10: 12:00:00 via INTRAVENOUS
  Filled 2016-04-10: qty 25

## 2016-04-10 MED ORDER — LACTATED RINGERS IV SOLN
INTRAVENOUS | Status: DC
  Administered 2016-04-10 (×2): via INTRAVENOUS

## 2016-04-10 MED ORDER — NALOXONE HCL 0.4 MG/ML IJ SOLN: 0.4000 mg | INTRAMUSCULAR | Status: DC | PRN

## 2016-04-10 MED ORDER — ONDANSETRON HCL 4 MG/2ML IJ SOLN
4.0000 mg | Freq: Four times a day (QID) | INTRAMUSCULAR | Status: DC | PRN
  Administered 2016-04-10: 4 mg via INTRAVENOUS
  Filled 2016-04-10: qty 2

## 2016-04-10 MED ORDER — HYDROMORPHONE HCL 1 MG/ML IJ SOLN
0.2500 mg | INTRAMUSCULAR | Status: DC | PRN
  Administered 2016-04-10 (×4): 0.5 mg via INTRAVENOUS

## 2016-04-10 MED ORDER — DIPHENHYDRAMINE HCL 12.5 MG/5ML PO ELIX: 12.5000 mg | ORAL_SOLUTION | Freq: Four times a day (QID) | ORAL | Status: DC | PRN

## 2016-04-10 MED ORDER — DEXTROSE IN LACTATED RINGERS 5 % IV SOLN
INTRAVENOUS | Status: DC
  Administered 2016-04-10 – 2016-04-11 (×2): via INTRAVENOUS

## 2016-04-10 MED ORDER — LACTATED RINGERS IR SOLN
Status: DC | PRN
  Administered 2016-04-10: 3000 mL

## 2016-04-10 MED ORDER — DEXAMETHASONE SODIUM PHOSPHATE 10 MG/ML IJ SOLN
INTRAMUSCULAR | Status: AC
  Filled 2016-04-10: qty 1

## 2016-04-10 MED ORDER — BUPIVACAINE HCL (PF) 0.25 % IJ SOLN
INTRAMUSCULAR | Status: AC
  Filled 2016-04-10: qty 30

## 2016-04-10 MED ORDER — FENTANYL CITRATE (PF) 250 MCG/5ML IJ SOLN
INTRAMUSCULAR | Status: AC
  Filled 2016-04-10: qty 5

## 2016-04-10 MED ORDER — ROCURONIUM BROMIDE 100 MG/10ML IV SOLN
INTRAVENOUS | Status: AC
  Filled 2016-04-10: qty 1

## 2016-04-10 MED ORDER — LIDOCAINE HCL (CARDIAC) 20 MG/ML IV SOLN
INTRAVENOUS | Status: AC
  Filled 2016-04-10: qty 5

## 2016-04-10 MED ORDER — FENTANYL CITRATE (PF) 100 MCG/2ML IJ SOLN
25.0000 ug | INTRAMUSCULAR | Status: DC | PRN
Start: 2016-04-10 — End: 2016-04-10

## 2016-04-10 MED ORDER — ROCURONIUM BROMIDE 10 MG/ML (PF) SYRINGE
PREFILLED_SYRINGE | INTRAVENOUS | Status: DC | PRN
  Administered 2016-04-10: 10 mg via INTRAVENOUS
  Administered 2016-04-10: 40 mg via INTRAVENOUS
  Administered 2016-04-10: 20 mg via INTRAVENOUS

## 2016-04-10 MED ORDER — HYDROMORPHONE HCL 1 MG/ML IJ SOLN
INTRAMUSCULAR | Status: AC
  Administered 2016-04-10: 0.5 mg via INTRAVENOUS
  Filled 2016-04-10: qty 1

## 2016-04-10 MED ORDER — SUGAMMADEX SODIUM 200 MG/2ML IV SOLN
INTRAVENOUS | Status: DC | PRN
  Administered 2016-04-10: 200 mg via INTRAVENOUS

## 2016-04-10 MED ORDER — BUPIVACAINE HCL (PF) 0.25 % IJ SOLN
INTRAMUSCULAR | Status: DC | PRN
  Administered 2016-04-10: 30 mL

## 2016-04-10 MED ORDER — KETOROLAC TROMETHAMINE 30 MG/ML IJ SOLN: 30.0000 mg | Freq: Once | INTRAMUSCULAR | Status: DC | PRN

## 2016-04-10 MED ORDER — LIDOCAINE 2% (20 MG/ML) 5 ML SYRINGE
INTRAMUSCULAR | Status: DC | PRN
  Administered 2016-04-10: 880 mg via INTRAVENOUS

## 2016-04-10 MED ORDER — SODIUM CHLORIDE 0.9% FLUSH: 9.0000 mL | INTRAVENOUS | Status: DC | PRN

## 2016-04-10 MED ORDER — SODIUM CHLORIDE 0.9 % IJ SOLN
INTRAMUSCULAR | Status: AC
  Filled 2016-04-10: qty 50

## 2016-04-10 MED ORDER — CEFAZOLIN SODIUM-DEXTROSE 2-4 GM/100ML-% IV SOLN
2.0000 g | INTRAVENOUS | Status: AC
  Administered 2016-04-10: 2 g via INTRAVENOUS

## 2016-04-10 MED ORDER — PROMETHAZINE HCL 25 MG/ML IJ SOLN: 6.2500 mg | INTRAMUSCULAR | Status: DC | PRN

## 2016-04-10 MED ORDER — VASOPRESSIN 20 UNIT/ML IV SOLN
INTRAVENOUS | Status: AC
  Filled 2016-04-10: qty 1

## 2016-04-10 MED ORDER — SUGAMMADEX SODIUM 200 MG/2ML IV SOLN
INTRAVENOUS | Status: AC
  Filled 2016-04-10: qty 2

## 2016-04-10 SURGICAL SUPPLY — 69 items
ADH SKN CLS APL DERMABOND .7 (GAUZE/BANDAGES/DRESSINGS) ×2
BARRIER ADHS 3X4 INTERCEED (GAUZE/BANDAGES/DRESSINGS) IMPLANT
BRR ADH 4X3 ABS CNTRL BYND (GAUZE/BANDAGES/DRESSINGS)
CATH FOLEY 3WAY  5CC 16FR (CATHETERS) ×2
CATH FOLEY 3WAY 5CC 16FR (CATHETERS) ×2 IMPLANT
CLOTH BEACON ORANGE TIMEOUT ST (SAFETY) ×4 IMPLANT
CONT PATH 16OZ SNAP LID 3702 (MISCELLANEOUS) ×4 IMPLANT
COVER BACK TABLE 60X90IN (DRAPES) ×8 IMPLANT
COVER TIP SHEARS 8 DVNC (MISCELLANEOUS) ×2 IMPLANT
COVER TIP SHEARS 8MM DA VINCI (MISCELLANEOUS) ×2
DECANTER SPIKE VIAL GLASS SM (MISCELLANEOUS) ×12 IMPLANT
DERMABOND ADVANCED (GAUZE/BANDAGES/DRESSINGS) ×2
DERMABOND ADVANCED .7 DNX12 (GAUZE/BANDAGES/DRESSINGS) ×2 IMPLANT
DURAPREP 26ML APPLICATOR (WOUND CARE) ×4 IMPLANT
ELECT NDL TIP 2.8 STRL (NEEDLE) IMPLANT
ELECT NEEDLE TIP 2.8 STRL (NEEDLE) IMPLANT
ELECT REM PT RETURN 9FT ADLT (ELECTROSURGICAL) ×4
ELECTRODE REM PT RTRN 9FT ADLT (ELECTROSURGICAL) ×2 IMPLANT
GAUZE PACKING 1 X5 YD ST (GAUZE/BANDAGES/DRESSINGS) IMPLANT
GAUZE PACKING 2X5 YD STRL (GAUZE/BANDAGES/DRESSINGS) IMPLANT
GAUZE VASELINE 3X9 (GAUZE/BANDAGES/DRESSINGS) IMPLANT
GLOVE BIO SURGEON STRL SZ7.5 (GLOVE) ×12 IMPLANT
GLOVE BIOGEL PI IND STRL 7.0 (GLOVE) ×4 IMPLANT
GLOVE BIOGEL PI INDICATOR 7.0 (GLOVE) ×4
GOWN STRL REUS W/TWL LRG LVL3 (GOWN DISPOSABLE) ×16 IMPLANT
KIT ACCESSORY DA VINCI DISP (KITS) ×2
KIT ACCESSORY DVNC DISP (KITS) ×2 IMPLANT
LEGGING LITHOTOMY PAIR STRL (DRAPES) ×4 IMPLANT
MANIPULATOR ADVINCU DEL 3.0 PL (MISCELLANEOUS) ×2 IMPLANT
MANIPULATOR ADVINCU DEL 4.0 PL (MISCELLANEOUS) ×2 IMPLANT
NEEDLE INSUFFLATION 150MM (ENDOMECHANICALS) ×4 IMPLANT
NS IRRIG 1000ML POUR BTL (IV SOLUTION) ×4 IMPLANT
OCCLUDER COLPOPNEUMO (BALLOONS) ×4 IMPLANT
PACK ROBOT WH (CUSTOM PROCEDURE TRAY) ×4 IMPLANT
PACK ROBOTIC GOWN (GOWN DISPOSABLE) ×4 IMPLANT
PACK TRENDGUARD 450 HYBRID PRO (MISCELLANEOUS) ×1 IMPLANT
PACK TRENDGUARD 600 HYBRD PROC (MISCELLANEOUS) IMPLANT
PACK VAGINAL WOMENS (CUSTOM PROCEDURE TRAY) ×4 IMPLANT
PAD PREP 24X48 CUFFED NSTRL (MISCELLANEOUS) ×4 IMPLANT
POUCH LAPAROSCOPIC INSTRUMENT (MISCELLANEOUS) IMPLANT
PROTECTOR NERVE ULNAR (MISCELLANEOUS) ×8 IMPLANT
SET CYSTO W/LG BORE CLAMP LF (SET/KITS/TRAYS/PACK) IMPLANT
SET IRRIG TUBING LAPAROSCOPIC (IRRIGATION / IRRIGATOR) ×4 IMPLANT
SET TRI-LUMEN FLTR TB AIRSEAL (TUBING) ×4 IMPLANT
SUT MON AB 2-0 CT2 27 (SUTURE) IMPLANT
SUT VIC AB 0 CT1 27 (SUTURE) ×8
SUT VIC AB 0 CT1 27XBRD ANBCTR (SUTURE) ×4 IMPLANT
SUT VIC AB 2-0 CT1 27 (SUTURE)
SUT VIC AB 2-0 CT1 TAPERPNT 27 (SUTURE) IMPLANT
SUT VICRYL 0 UR6 27IN ABS (SUTURE) ×4 IMPLANT
SUT VICRYL 3 0 CT 3 (SUTURE) IMPLANT
SUT VICRYL RAPIDE 4/0 PS 2 (SUTURE) ×8 IMPLANT
SUT VLOC 180 0 9IN  GS21 (SUTURE) ×2
SUT VLOC 180 0 9IN GS21 (SUTURE) ×1 IMPLANT
SYR 50ML LL SCALE MARK (SYRINGE) ×4 IMPLANT
SYRINGE 10CC LL (SYRINGE) ×4 IMPLANT
TIP RUMI ORANGE 6.7MMX12CM (TIP) IMPLANT
TIP UTERINE 5.1X6CM LAV DISP (MISCELLANEOUS) IMPLANT
TIP UTERINE 6.7X10CM GRN DISP (MISCELLANEOUS) IMPLANT
TIP UTERINE 6.7X6CM WHT DISP (MISCELLANEOUS) IMPLANT
TIP UTERINE 6.7X8CM BLUE DISP (MISCELLANEOUS) ×3 IMPLANT
TOWEL OR 17X24 6PK STRL BLUE (TOWEL DISPOSABLE) ×12 IMPLANT
TRAY FOLEY CATH SILVER 14FR (SET/KITS/TRAYS/PACK) ×1 IMPLANT
TRENDGUARD 450 HYBRID PRO PACK (MISCELLANEOUS) ×4
TRENDGUARD 600 HYBRID PROC PK (MISCELLANEOUS)
TROCAR DISP BLADELESS 8 DVNC (TROCAR) ×2 IMPLANT
TROCAR DISP BLADELESS 8MM (TROCAR) ×2
TROCAR PORT AIRSEAL 5X120 (TROCAR) ×4 IMPLANT
TROCAR Z-THREAD 12X150 (TROCAR) ×4 IMPLANT

## 2016-04-10 NOTE — Op Note (Signed)
[name], Murphy NO.:  [id number]  MEDICAL RECORD NO.:  [id number]  LOCATION:  PERIO                         FACILITY:  [facility]  PHYSICIAN:  Lovenia Kim, M.D.DATE OF BIRTH:  02-Apr-1985  DATE OF PROCEDURE: DATE OF DISCHARGE:                              OPERATIVE REPORT   PREOPERATIVE DIAGNOSES:  Severe dysmenorrhea, menorrhagia, abnormal Pap smear.  POSTOPERATIVE DIAGNOSES:  Severe dysmenorrhea, menorrhagia, abnormal Pap smear, pelvic adhesions of sigmoid colon to the left adnexa, enterocele, anterior cul-de-sac endometriosis, and anterior cul-de-sac adhesions.  PROCEDURES:  Da Vinci assisted total laparoscopic hysterectomy, bilateral salpingectomy, excision of anterior cul-de-sac endometriosis, ablation of anterior cul-de-sac endometriosis, lysis of adhesions in the anterior cul-de-sac and sigmoid colon to left adnexa, McCall culdoplasty.  SURGEON:  Lovenia Kim, M.D.  ASSISTANT:  Mel Almond.  ANESTHESIA:  Local and general.  ESTIMATED BLOOD LOSS:  50 mL.  COMPLICATIONS:  None.  DRAINS:  Foley.  COUNTS:  Correct.  DISPOSITION:  The patient to Recovery in good condition.  FINDINGS:  Anterior cul-de-sac endometriosis.  Normal uterus.  Normal posterior cul-de-sac.  Anterior cul-de-sac adhesions.  Adhesions of sigmoid colon to the left adnexa.  Bilateral normal ovaries.  DESCRIPTION OF PROCEDURE:  After being apprised of risks of anesthesia, infection, bleeding, injury to surrounding organs, possible need for repair, delayed versus immediate complications to include bowel and bladder injury, possible need for repair.  The patient was consented, signed, was brought to the operating room where she was administered general anesthetic without complications.  Prepped and draped in usual sterile fashion.  Foley catheter placed.  RUMI retractor placed vaginally without difficulty.  At this time, umbilical incision made with a scalpel.  Veress  needle placed, opening pressure -6, 3 liters of CO2 insufflated without difficulty, trocar placement atraumatically. Visualization revealed the above noted findings to also include normal liver, gallbladder bed, normal appendiceal area.  At this time, 2 additional trocar sites are made on the left and one on the right. Pictures were taken.  Assuring atraumatic trocar entry.  At this time, two 8 mm ports and a 5-mm port were placed.  The robot was docked in a standard fashion with placement of PK forceps and scissors.  At this time, the adhesions of the left sigmoid colon to the left adnexa are lysed sharply without difficulty.  Ureters identified bilaterally.  The anterior cul-de-sac adhesions were lysed as well along the left adnexa and the left tube which had been previously cauterized, was divided and excised and placed in the cul-de-sac along the mesosalpinx.  The retroperitoneal space was entered on the left.  The ureter was re- identified.  The ovarian ligament on the left was divided.  The retroperitoneal space was further developed, pushing the ureter caudad from that level of dissection.  The broad ligament was dissected down to the level of the uterocervical junction.  The round ligament was divided and a bladder flap was developed sharply, skeletonizing the left uterine vessels which were cauterized with bipolar cautery, but not cut. Further development of bladder flap was performed after excision of the anterior cul-de-sac adhesions and the anterior cul-de-sac endometriosis which was removed and sent separately.  At this time on the  right side, the tube was excised in a similar fashion.  Bipolar cautery along the mesosalpinx and placement of the tubal segment cul-de-sac. Retroperitoneal space was entered on the right.  The ureter was identified.  The ovarian ligament was bipolar coagulated and cut.  The broad ligament posterior leaf was developed further down to the level  of the cervicovaginal junction, pushing the ureter caudad to the point of dissection.  The round ligament was opened, divided.  Bladder flap was developed sharply along with the right side.  The uterine vessels on the right are cauterized and cut.  The uterine vessels on the left cauterized and cut.  At this time, the RUMI cup was exposed circumferentially 360 degrees, and the specimen was excised by using monopolar cautery to divide the specimen along the cervicovaginal junction.  The specimen was retracted vaginally and the tubal segments were removed as well.  The bladder flap was further sharply dissected off the anterior vaginal mucosa and the vagina was closed using a 0 V- Loc suture in a continuous running fashion, second imbricating layer was placed.  McCall culdoplasty suture was placed atraumatically. Reperitonealization was performed.  Ureters were identified, peristalsing normal bilaterally.  At this time, vagina was inspected vaginally and found to be intact as well.  Irrigation was accomplished. Good hemostasis noted.  All instruments were removed under direct visualization.  Robot was undocked and removed from the field. Revisualization reveals no evidence of active bleeding.  CO2 was released, assuring good hemostasis.  Trocars were then removed under direct visualization as the AirSeal was stopped as well.  Ropivacaine solution has been placed prior to trocar removal, 60 mL total. Incisions were closed using 0 Vicryl, 4-0 Vicryl, Dermabond, bupivacaine placed.  The patient tolerated the procedure well, was awakened and transferred to recovery in good condition.     Lovenia Kim, M.D.     RJT/MEDQ  D:  04/10/2016  T:  04/10/2016  Job:  [id number]

## 2016-04-10 NOTE — Transfer of Care (Signed)
Immediate Anesthesia Transfer of Care Note  Patient: [name]  Procedure(s) Performed: Procedure(s): ROBOTIC ASSISTED TOTAL HYSTERECTOMY WITH SALPINGECTOMY/ Excision of Endometriosis (Bilateral)  Patient Location: PACU  Anesthesia Type:General  Level of Consciousness:  sedated, patient cooperative and responds to stimulation  Airway & Oxygen Therapy:Patient Spontanous Breathing and Patient connected to face mask oxgen  Post-op Assessment:  Report given to PACU RN and Post -op Vital signs reviewed and stable  Post vital signs:  Reviewed and stable  Last Vitals:  Vitals:   04/10/16 0711  BP: 122/78  Pulse: 88  Resp: 16  Temp: 01.7 C    Complications: No apparent anesthesia complications

## 2016-04-10 NOTE — Anesthesia Procedure Notes (Signed)
Procedure Name: Intubation Date/Time: 04/10/2016 8:35 AM Performed by: Talbot Grumbling Pre-anesthesia Checklist: Patient identified, Emergency Drugs available, Suction available and Patient being monitored Patient Re-evaluated:Patient Re-evaluated prior to inductionOxygen Delivery Method: Circle system utilized Preoxygenation: Pre-oxygenation with 100% oxygen Intubation Type: IV induction Ventilation: Mask ventilation without difficulty Laryngoscope Size: Miller and 2 Grade View: Grade I Tube type: Oral Tube size: 7.0 mm Number of attempts: 1 Airway Equipment and Method: Stylet Placement Confirmation: ETT inserted through vocal cords under direct vision,  positive ETCO2 and breath sounds checked- equal and bilateral Secured at: 21 cm Tube secured with: Tape Dental Injury: Teeth and Oropharynx as per pre-operative assessment

## 2016-04-10 NOTE — Progress Notes (Signed)
Pt OOB and ambulating in the hallway without difficulty. Patient also tolerated a regular diet. Toya Smothers, RN

## 2016-04-10 NOTE — Anesthesia Postprocedure Evaluation (Signed)
Anesthesia Post Note  Patient: [name]  Procedure(s) Performed: Procedure(s) (LRB): ROBOTIC ASSISTED TOTAL HYSTERECTOMY WITH SALPINGECTOMY/ Excision of Endometriosis (Bilateral)  Patient location during evaluation: [facility] Anesthesia Type: General Level of consciousness: awake and alert and oriented Pain management: pain level controlled Vital Signs Assessment: post-procedure vital signs reviewed and stable Respiratory status: spontaneous breathing and nonlabored ventilation Cardiovascular status: stable Postop Assessment: no signs of nausea or vomiting and adequate PO intake Anesthetic complications: no        Last Vitals:  Vitals:   04/10/16 1300 04/10/16 1330  BP: 101/68 (!) 102/57  Pulse:    Resp:    Temp:      Last Pain:  Vitals:   04/10/16 1341  TempSrc:   PainSc: 2    Pain Goal: Patients Stated Pain Goal: 3 (04/10/16 1341)               Jarrell Armond Hristova

## 2016-04-10 NOTE — Anesthesia Postprocedure Evaluation (Signed)
Anesthesia Post Note  Patient: FEIGA NADEL  Procedure(s) Performed: Procedure(s) (LRB): ROBOTIC ASSISTED TOTAL HYSTERECTOMY WITH SALPINGECTOMY/ Excision of Endometriosis (Bilateral)  Patient location during evaluation: PACU Anesthesia Type: General Level of consciousness: awake and alert Pain management: pain level controlled Vital Signs Assessment: post-procedure vital signs reviewed and stable Respiratory status: spontaneous breathing, nonlabored ventilation, respiratory function stable and patient connected to nasal cannula oxygen Cardiovascular status: blood pressure returned to baseline and stable Postop Assessment: no signs of nausea or vomiting Anesthetic complications: no        Last Vitals:  Vitals:   04/10/16 1226 04/10/16 1250  BP:  111/61  Pulse:  88  Resp: 16 16  Temp:  36.8 C    Last Pain:  Vitals:   04/10/16 1241  TempSrc:   PainSc: 9    Pain Goal: Patients Stated Pain Goal: 3 (04/10/16 1241)               Tiajuana Amass

## 2016-04-10 NOTE — Anesthesia Preprocedure Evaluation (Addendum)
Anesthesia Evaluation  Patient identified by MRN, date of birth, ID band Patient awake    Reviewed: Allergy & Precautions, NPO status , Patient's Chart, lab work & pertinent test results  Airway Mallampati: II  TM Distance: >3 FB Neck ROM: Full    Dental  (+) Dental Advisory Given   Pulmonary Current Smoker,    breath sounds clear to auscultation       Cardiovascular negative cardio ROS   Rhythm:Regular Rate:Normal     Neuro/Psych Anxiety negative neurological ROS     GI/Hepatic Neg liver ROS, GERD  ,  Endo/Other  negative endocrine ROS  Renal/GU negative Renal ROS     Musculoskeletal   Abdominal   Peds  Hematology negative hematology ROS (+)   Anesthesia Other Findings   Reproductive/Obstetrics                             Lab Results  Component Value Date   WBC 8.4 03/27/2016   HGB 15.3 (H) 03/27/2016   HCT 45.3 03/27/2016   MCV 89.3 03/27/2016   PLT 285 03/27/2016   Lab Results  Component Value Date   CREATININE 0.69 03/27/2016   BUN 7 03/27/2016   NA 140 03/27/2016   K 4.3 03/27/2016   CL 105 03/27/2016   CO2 31 03/27/2016    Anesthesia Physical Anesthesia Plan  ASA: II  Anesthesia Plan: General   Post-op Pain Management:    Induction: Intravenous  Airway Management Planned: Oral ETT  Additional Equipment:   Intra-op Plan:   Post-operative Plan: Extubation in OR  Informed Consent: I have reviewed the patients History and Physical, chart, labs and discussed the procedure including the risks, benefits and alternatives for the proposed anesthesia with the patient or authorized representative who has indicated his/her understanding and acceptance.     Plan Discussed with:   Anesthesia Plan Comments:         Anesthesia Quick Evaluation

## 2016-04-10 NOTE — Addendum Note (Signed)
Addendum  created 04/10/16 1353 by Hewitt Blade, CRNA   Sign clinical note

## 2016-04-10 NOTE — Progress Notes (Signed)
Patient seen and examined. Consent witnessed and signed. No changes noted. Update completed.Patient ID: [name], female   DOB: [birth year], 31 y.o.   MRN: [id number]

## 2016-04-10 NOTE — Op Note (Signed)
04/10/2016  10:43 AM  PATIENT:  [name]  31 y.o. female  PRE-OPERATIVE DIAGNOSIS:  DYSMENORRHEA, MENORRHAGIA,ENDOMETRIOSIS  POST-OPERATIVE DIAGNOSIS:  DYSMENORRHEA, MENORRHAGIA,ENDOMETRIOS  PROCEDURE:  Procedure(s): ROBOTIC ASSISTED TOTAL HYSTERECTOMY  BILATERAL SALPINGECTOMY  ABLATION/Excision of Endometriosis-ANTERIOR CUL DE SAC MCCALL CUL DE PLASTY LYSIS OF SIGMOID ADHESIONS   SURGEON:  Surgeon(s): Brien Few, MD  ASSISTANTS: Mel Almond, CNM, FACM   ANESTHESIA:   local and general  ESTIMATED BLOOD LOSS: 50CC  DRAINS: Urinary Catheter (Foley)   LOCAL MEDICATIONS USED:  MARCAINE    and Amount: 30 ml  SPECIMEN:  Source of Specimen:  UTERUS, CERVIX, BILATERAL TUBAL SEGMENTS, CUL DE IMPLANT  DISPOSITION OF SPECIMEN:  PATHOLOGY  COUNTS:  YES  DICTATION #: [id number]  PLAN OF CARE: DC IN AM  PATIENT DISPOSITION:  PACU - hemodynamically stable.

## 2016-04-11 ENCOUNTER — Encounter (HOSPITAL_COMMUNITY): Payer: Self-pay | Admitting: Obstetrics and Gynecology

## 2016-04-11 DIAGNOSIS — N920 Excessive and frequent menstruation with regular cycle: Secondary | ICD-10-CM | POA: Diagnosis not present

## 2016-04-11 LAB — BASIC METABOLIC PANEL
ANION GAP: 4 — AB (ref 5–15)
BUN: 7 mg/dL (ref 6–20)
CHLORIDE: 104 mmol/L (ref 101–111)
CO2: 30 mmol/L (ref 22–32)
Calcium: 8.4 mg/dL — ABNORMAL LOW (ref 8.9–10.3)
Creatinine, Ser: 0.58 mg/dL (ref 0.44–1.00)
GFR calc non Af Amer: >60 mL/min (ref >60)
Glucose, Bld: 133 mg/dL — ABNORMAL HIGH (ref 65–99)
POTASSIUM: 3.9 mmol/L (ref 3.5–5.1)
Sodium: 138 mmol/L (ref 135–145)

## 2016-04-11 LAB — CBC
HCT: 32.3 % — ABNORMAL LOW (ref 36.0–46.0)
HEMOGLOBIN: 10.8 g/dL — AB (ref 12.0–15.0)
MCH: 30.7 pg (ref 26.0–34.0)
MCHC: 33.4 g/dL (ref 30.0–36.0)
MCV: 91.8 fL (ref 78.0–100.0)
Platelets: 186 10*3/uL (ref 150–400)
RBC: 3.52 MIL/uL — AB (ref 3.87–5.11)
RDW: 13.1 % (ref 11.5–15.5)
WBC: 10.4 10*3/uL (ref 4.0–10.5)

## 2016-04-11 MED ORDER — OXYCODONE-ACETAMINOPHEN 5-325 MG PO TABS: 1.0000 | ORAL_TABLET | ORAL | 0 refills | Status: DC | PRN

## 2016-04-11 NOTE — Progress Notes (Signed)
Pt desated between 85 -89% when she fell asleep and could not tolerate wearing nasal canula and the beeps from machine was disturbing her sleep . Pt requested for simple nasal mask instead for the night and pca turned off but left in machine just in case she needed it until morning.Marland Kitchen Pt preferres to try and  take p.o pain meds.She will let nurse know if she wants pca turned back on.

## 2016-04-11 NOTE — Progress Notes (Signed)
1 Day Post-Op Procedure(s) (LRB): ROBOTIC ASSISTED TOTAL HYSTERECTOMY WITH SALPINGECTOMY/ Excision of Endometriosis (Bilateral)  Subjective: Patient reports nausea, incisional pain, tolerating PO, + flatus and no problems voiding.    Objective: BP (!) 95/54 (BP Location: Right Arm)   Pulse 66   Temp 98.4 F (36.9 C) (Oral)   Resp 18   Ht 5\' 3"  (1.6 m)   Wt 64.4 kg (142 lb)   SpO2 98%   BMI 25.15 kg/m   CBC    Component Value Date/Time   WBC 10.4 04/11/2016 0537   RBC 3.52 (L) 04/11/2016 0537   HGB 10.8 (L) 04/11/2016 0537   HCT 32.3 (L) 04/11/2016 0537   PLT 186 04/11/2016 0537   MCV 91.8 04/11/2016 0537   MCH 30.7 04/11/2016 0537   MCHC 33.4 04/11/2016 0537   RDW 13.1 04/11/2016 0537   LYMPHSABS 1,925 11/06/2015 0914   MONOABS 385 11/06/2015 0914   EOSABS 154 11/06/2015 0914   BASOSABS 0 11/06/2015 0914    I have reviewed patient's vital signs, intake and output, medications and labs.  General: alert, cooperative and appears stated age Resp: clear to auscultation bilaterally and normal percussion bilaterally Cardio: regular rate and rhythm, S1, S2 normal, no murmur, click, rub or gallop and regular rate and rhythm GI: soft, non-tender; bowel sounds normal; no masses,  no organomegaly and incision: clean, dry and intact Extremities: extremities normal, atraumatic, no cyanosis or edema Vaginal Bleeding: minimal  Assessment: s/p Procedure(s): ROBOTIC ASSISTED TOTAL HYSTERECTOMY WITH SALPINGECTOMY/ Excision of Endometriosis (Bilateral): stable, progressing well and tolerating diet  Plan: Advance diet Encourage ambulation Advance to PO medication Discontinue IV fluids Discharge home  LOS: 0 days    Jestine Bicknell J 04/11/2016, 9:13 AM

## 2016-07-06 ENCOUNTER — Emergency Department (HOSPITAL_BASED_OUTPATIENT_CLINIC_OR_DEPARTMENT_OTHER)
Admission: EM | Admit: 2016-07-06 | Discharge: 2016-07-06 | Disposition: A | Discharge status: Home / Self Care | Payer: Commercial Managed Care - PPO | Attending: Emergency Medicine | Admitting: Emergency Medicine

## 2016-07-06 ENCOUNTER — Emergency Department (HOSPITAL_BASED_OUTPATIENT_CLINIC_OR_DEPARTMENT_OTHER): Payer: Commercial Managed Care - PPO

## 2016-07-06 ENCOUNTER — Encounter (HOSPITAL_BASED_OUTPATIENT_CLINIC_OR_DEPARTMENT_OTHER): Payer: Self-pay | Admitting: *Deleted

## 2016-07-06 DIAGNOSIS — Y999 Unspecified external cause status: Secondary | ICD-10-CM | POA: Insufficient documentation

## 2016-07-06 DIAGNOSIS — Z79899 Other long term (current) drug therapy: Secondary | ICD-10-CM | POA: Diagnosis not present

## 2016-07-06 DIAGNOSIS — S61021A Laceration with foreign body of right thumb without damage to nail, initial encounter: Secondary | ICD-10-CM | POA: Diagnosis not present

## 2016-07-06 DIAGNOSIS — F17210 Nicotine dependence, cigarettes, uncomplicated: Secondary | ICD-10-CM | POA: Diagnosis not present

## 2016-07-06 DIAGNOSIS — W25XXXA Contact with sharp glass, initial encounter: Secondary | ICD-10-CM | POA: Diagnosis not present

## 2016-07-06 DIAGNOSIS — Y939 Activity, unspecified: Secondary | ICD-10-CM | POA: Insufficient documentation

## 2016-07-06 DIAGNOSIS — S6991XA Unspecified injury of right wrist, hand and finger(s), initial encounter: Secondary | ICD-10-CM | POA: Diagnosis present

## 2016-07-06 DIAGNOSIS — Y929 Unspecified place or not applicable: Secondary | ICD-10-CM | POA: Insufficient documentation

## 2016-07-06 MED ORDER — LIDOCAINE HCL (PF) 1 % IJ SOLN: 5.0000 mL | Freq: Once | INTRAMUSCULAR | Status: DC

## 2016-07-06 MED ORDER — LIDOCAINE HCL (PF) 1 % IJ SOLN
INTRAMUSCULAR | Status: AC
  Filled 2016-07-06: qty 5

## 2016-07-06 NOTE — ED Triage Notes (Signed)
Laceration to her right thumb on piece of glass. She went to [facility] and they sent her here. Bleeding controlled.

## 2016-07-06 NOTE — ED Provider Notes (Signed)
[facility]PT [facility] Provider Note   CSN: [id number] Arrival date & time: 07/06/16  1906  By signing my name below, I, [name], attest that this documentation has been prepared under the direction and in the presence of Margarita Mail, PA-C. Electronically Signed: [name], Scribe. 07/06/2016. 10:12 PM.  History   Chief Complaint Chief Complaint  Patient presents with  . Laceration   The history is provided by the patient. No language interpreter was used.    HPI Comments: [name] is a 31 y.o. female who presents to the Emergency Department for evaluation of a laceration to her right thumb sustained about three hours ago. Patient inadvertently lacerated her right thumb on a piece of glass. She notes the wound has bled significantly and her bleeding has persisted even after application of an applied pressure dressing. She reports severe pain secondary to the wound which is worse with flexion and extension of the digit. Patient was evaluated at Urgent Care prior to arrival and was advised to seek treatment in the ED. Her tetanus status is UTD. She is allergic to iodine. Patient denies focal weakness, nausea, vomiting, or any other associated symptoms.  Past Medical History:  Diagnosis Date  . Abnormal Pap smear   . Active labor - pitocin induction 03/09/2011  . Anxiety   . Complication of anesthesia    long time in recovery room to stabilize low blood pressure, low pulse, and low oxygen  . Condyloma acuminatum   . Cystitides, interstitial, chronic   . Cystitis, interstitial   . Follicular cyst of ovary   . Galactorrhea not associated with childbirth   . GERD (gastroesophageal reflux disease)   . History of endometritis   . Mild dysplasia of cervix   . Other chronic cystitis   . Papanicolaou smear of cervix with atypical squamous cells of undetermined significance (ASC-US)   . Sinus tachycardia    mild    Patient Active Problem List   Diagnosis Date Noted    . Dysmenorrhea 04/10/2016  . Postpartum care following vaginal delivery (3/4) 03/10/2011  . TOBACCO USE 09/20/2006  . INTERSTITIAL CYSTITIS 09/20/2006  . HEADACHE 09/20/2006  . ABDOMINAL PAIN, RECURRENT 09/20/2006  . CHICKENPOX, HX OF 09/20/2006    Past Surgical History:  Procedure Laterality Date  . ABDOMINAL HYSTERECTOMY    . BLADDER AUGMENTATION    . COLPOSCOPY    . LAPAROSCOPIC TUBAL LIGATION Bilateral 04/05/2015   Procedure: LAPAROSCOPIC BILATERAL TUBAL LIGATION;  Surgeon: Brien Few, MD;  Location: [facility];  Service: Gynecology;  Laterality: Bilateral;  . LAPAROSCOPY  2006   exploration fallopian tubes, bladder procedure  . MYRINGOTOMY    . ROBOTIC ASSISTED TOTAL HYSTERECTOMY WITH SALPINGECTOMY Bilateral 04/10/2016   Procedure: ROBOTIC ASSISTED TOTAL HYSTERECTOMY WITH SALPINGECTOMY/ Excision of Endometriosis;  Surgeon: Brien Few, MD;  Location: [facility];  Service: Gynecology;  Laterality: Bilateral;  . WISDOM TOOTH EXTRACTION      OB History    Gravida Para Term Preterm AB Living   2 2 2  0 0 2   SAB TAB Ectopic Multiple Live Births   0 0 0 0 2       Home Medications    Prior to Admission medications   Medication Sig Start Date End Date Taking? Authorizing Provider  diazepam (VALIUM) 5 MG tablet Take 5 mg by mouth daily as needed for anxiety.   Yes [provider]  acetaminophen (TYLENOL) 500 MG tablet Take 500 mg by mouth every 6 (six) hours as  needed.    [provider]  ibuprofen (ADVIL,MOTRIN) 200 MG tablet Take 800 mg by mouth every 6 (six) hours as needed for mild pain.    [provider]  omeprazole (PRILOSEC) 40 MG capsule Take 40 mg by mouth daily.    [provider]  oxyCODONE-acetaminophen (PERCOCET/ROXICET) 5-325 MG tablet Take 1-2 tablets by mouth every 4 (four) hours as needed for severe pain. 04/11/16   Brien Few, MD    Family History Family History  Problem Relation Age of Onset  . Anemia Mother   .  Depression Father   . Migraines Maternal Grandmother   . Heart attack Maternal Grandmother   . Diabetes Maternal Grandfather   . Heart disease Maternal Grandfather   . Diabetes Paternal Grandmother   . COPD Paternal Grandfather   . Autoimmune disease Paternal Grandfather        Howard Wilden's Autoimmune Disease    Social History Social History  Substance Use Topics  . Smoking status: Current Every Day Smoker    Packs/day: 0.50    Years: 15.00    Types: Cigarettes  . Smokeless tobacco: Current User  . Alcohol use Yes     Comment: social     Allergies   Morphine and related; Shellfish allergy; Tramadol; Iodine; and Sulfonamide derivatives   Review of Systems Review of Systems  Gastrointestinal: Negative for nausea and vomiting.  Skin: Positive for wound.  Neurological: Negative for weakness.   Physical Exam Updated Vital Signs BP 124/82   Pulse 88   Temp 98.9 F (37.2 C) (Oral)   Resp 20   Ht 5\' 3"  (1.6 m)   Wt 140 lb (63.5 kg)   LMP 03/11/2016 (Exact Date)   SpO2 99%   BMI 24.80 kg/m   Physical Exam  Constitutional: She is oriented to person, place, and time. She appears well-developed and well-nourished. No distress.  HENT:  Head: Normocephalic and atraumatic.  Eyes: Conjunctivae and EOM are normal.  Neck: Neck supple. No tracheal deviation present.  Cardiovascular: Normal rate.   Pulmonary/Chest: Effort normal. No respiratory distress.  Musculoskeletal: Normal range of motion.  Neurological: She is alert and oriented to person, place, and time.  Skin: Skin is warm and dry.  Right thumb: 4 cm laceration across the pad of the digit. Superficial. No evidence of tendon involvement. Full ROM and strength at PIP and DIP joints. Normal sensation.  Psychiatric: She has a normal mood and affect. Her behavior is normal.  Nursing note and vitals reviewed.  ED Treatments / Results  Labs (all labs ordered are listed, but only abnormal results are  displayed) Labs Reviewed - No data to display  EKG  EKG Interpretation None       Radiology Dg Finger Thumb Right  Result Date: 07/06/2016 CLINICAL DATA:  Cut right thumb with a piece of glass today. Assess for foreign body. EXAM: RIGHT THUMB 2+V COMPARISON:  None. FINDINGS: There is no evidence of fracture or dislocation. There is no evidence of arthropathy or other focal bone abnormality. Soft tissues are unremarkable. There is no radiopaque foreign body. IMPRESSION: No acute bony abnormality.  No radiopaque foreign body. Electronically Signed   By: Abelardo Diesel M.D.   On: 07/06/2016 19:25    Procedures .Marland KitchenLaceration Repair Date/Time: 07/06/2016 9:44 PM Performed by: Margarita Mail Authorized by: Margarita Mail   Consent:    Consent obtained:  Verbal   Consent given by:  Patient Anesthesia (see MAR for exact dosages):    Anesthesia  method:  Local infiltration   Local anesthetic:  Lidocaine 1% w/o epi (4 cc's) Laceration details:    Location:  Finger   Finger location:  R thumb Repair type:    Repair type:  Simple Pre-procedure details:    Preparation:  Patient was prepped and draped in usual sterile fashion Exploration:    Hemostasis achieved with:  Direct pressure and tied off vessels Treatment:    Area cleansed with:  Hibiclens   Amount of cleaning:  Standard   Irrigation solution:  Sterile saline   Irrigation method:  Syringe   Visualized foreign bodies/material removed: no   Skin repair:    Repair method:  Sutures   Suture size:  4-0   Suture material:  Chromic gut   Suture technique:  Simple interrupted   Number of sutures:  6 Approximation:    Approximation:  Close   Vermilion border: well-aligned   Post-procedure details:    Dressing:  Open (no dressing)   Patient tolerance of procedure:  Tolerated well, no immediate complications   (including critical care time)  DIAGNOSTIC STUDIES: Oxygen Saturation is 99% on RA, normal by my interpretation.     COORDINATION OF CARE: 10:11 PM Discussed treatment plan with pt at bedside and pt agreed to plan.  Medications Ordered in ED Medications  lidocaine (PF) (XYLOCAINE) 1 % injection 5 mL (not administered)  lidocaine (PF) (XYLOCAINE) 1 % injection (not administered)     Initial Impression / Assessment and Plan / ED Course  I have reviewed the triage vital signs and the nursing notes.  Pertinent labs & imaging results that were available during my care of the patient were reviewed by me and considered in my medical decision making (see chart for details).     Tetanus UTD. Laceration occurred < 12 hours prior to repair. Discussed laceration care with pt and answered questions. Pt to f-u for wound check should there be signs of dehiscence or infection. Pt is hemodynamically stable with no complaints prior to dc.    Final Clinical Impressions(s) / ED Diagnoses   Final diagnoses:  Laceration of right thumb with foreign body without damage to nail, initial encounter    New Prescriptions New Prescriptions   No medications on file   I personally performed the services described in this documentation, which was scribed in my presence. The recorded information has been reviewed and is accurate.       Margarita Mail, PA-C 07/08/16 0100    Little, Wenda Overland, MD 07/08/16 [phone]

## 2016-07-06 NOTE — Discharge Instructions (Signed)
Get help right away if: You develop severe swelling around the injury site. Your pain suddenly increases and is severe. You develop painful lumps near the wound or on skin that is anywhere on your body. You have a red streak going away from your wound. The wound is on your hand or foot and you cannot properly move a finger or toe. The wound is on your hand or foot and you notice that your fingers or toes look pale or bluish.

## 2017-01-05 HISTORY — PX: CHOLECYSTECTOMY: SHX55

## 2019-01-16 ENCOUNTER — Other Ambulatory Visit: Payer: Self-pay | Admitting: Chiropractic Medicine

## 2019-01-16 DIAGNOSIS — M47896 Other spondylosis, lumbar region: Secondary | ICD-10-CM

## 2019-01-20 ENCOUNTER — Ambulatory Visit
Admission: RE | Admit: 2019-01-20 | Discharge: 2019-01-20 | Disposition: A | Discharge status: Home / Self Care | Payer: Commercial Managed Care - PPO | Source: Ambulatory Visit | Attending: Chiropractic Medicine | Admitting: Chiropractic Medicine

## 2019-01-20 ENCOUNTER — Other Ambulatory Visit: Payer: Self-pay | Admitting: Chiropractic Medicine

## 2019-01-20 ENCOUNTER — Other Ambulatory Visit: Payer: Self-pay

## 2019-01-20 DIAGNOSIS — M47896 Other spondylosis, lumbar region: Secondary | ICD-10-CM

## 2019-01-20 IMAGING — XA DG FACET JT INJ L OR S SPINE 2ND LEVEL *R*
4 series · 4 of 4 positions shown · non-contrast
Comparison: none

CLINICAL DATA: Low back pain extending into the buttocks
bilaterally.

[Series 1: ortho standard · 1 of 1 slices shown (1 of 4)]
[im 1/1]
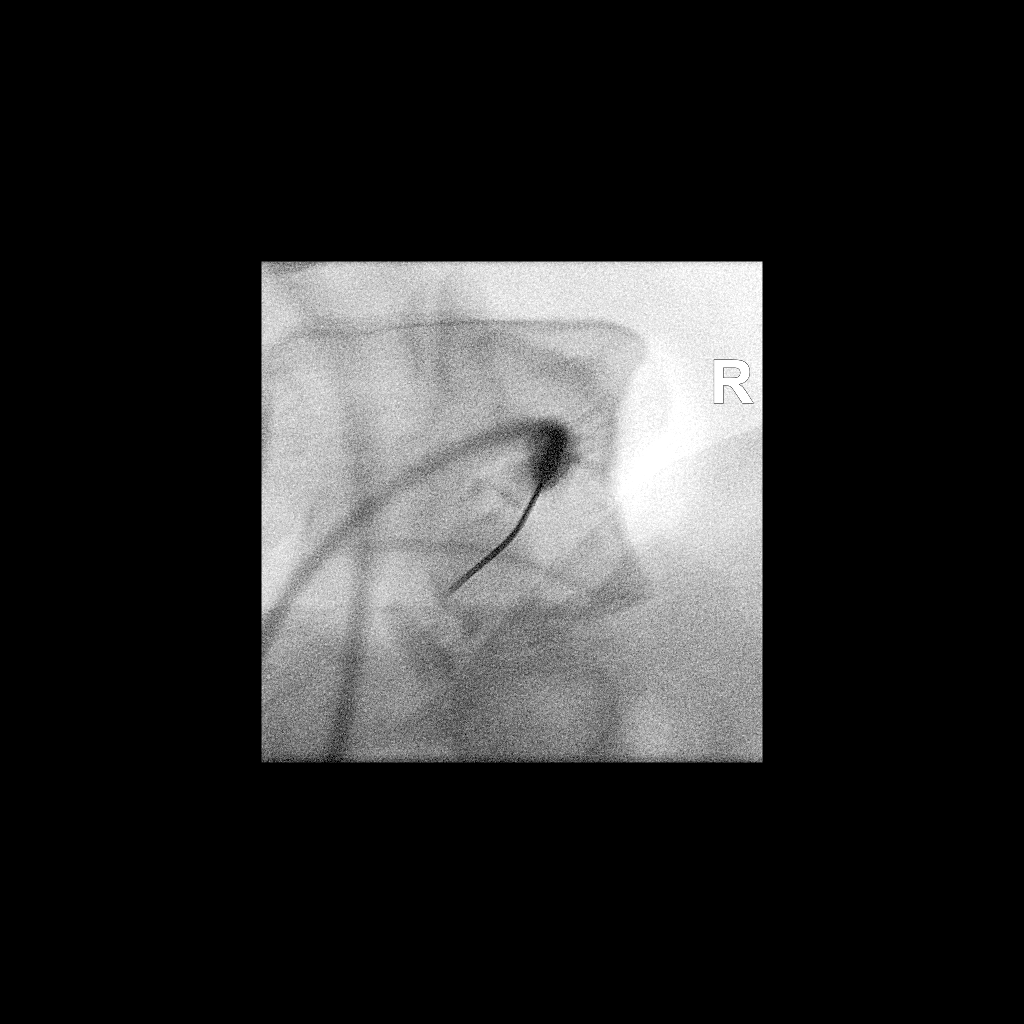

[Series 2: ortho standard · 1 of 1 slices shown (2 of 4)]
[im 1/1]
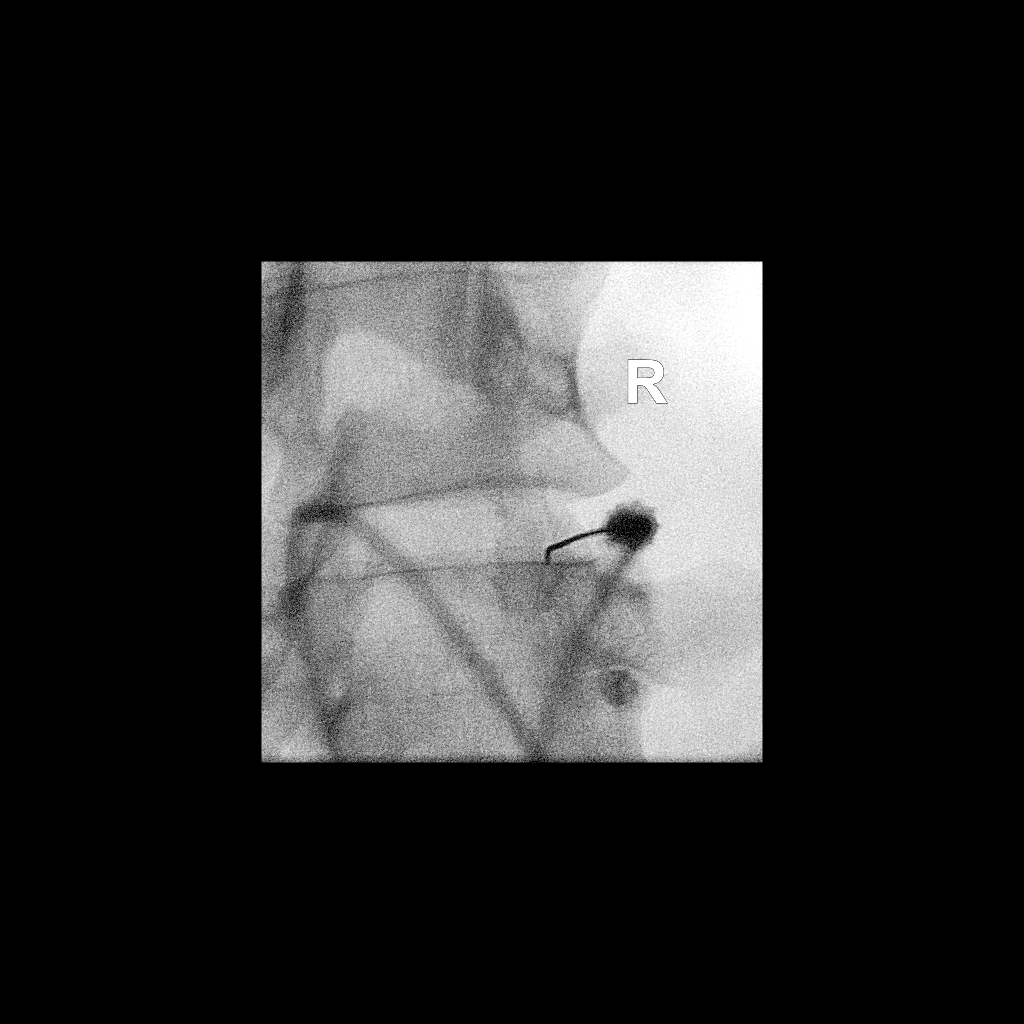

[Series 3: ortho standard · 1 of 1 slices shown (3 of 4)]
[im 1/1]
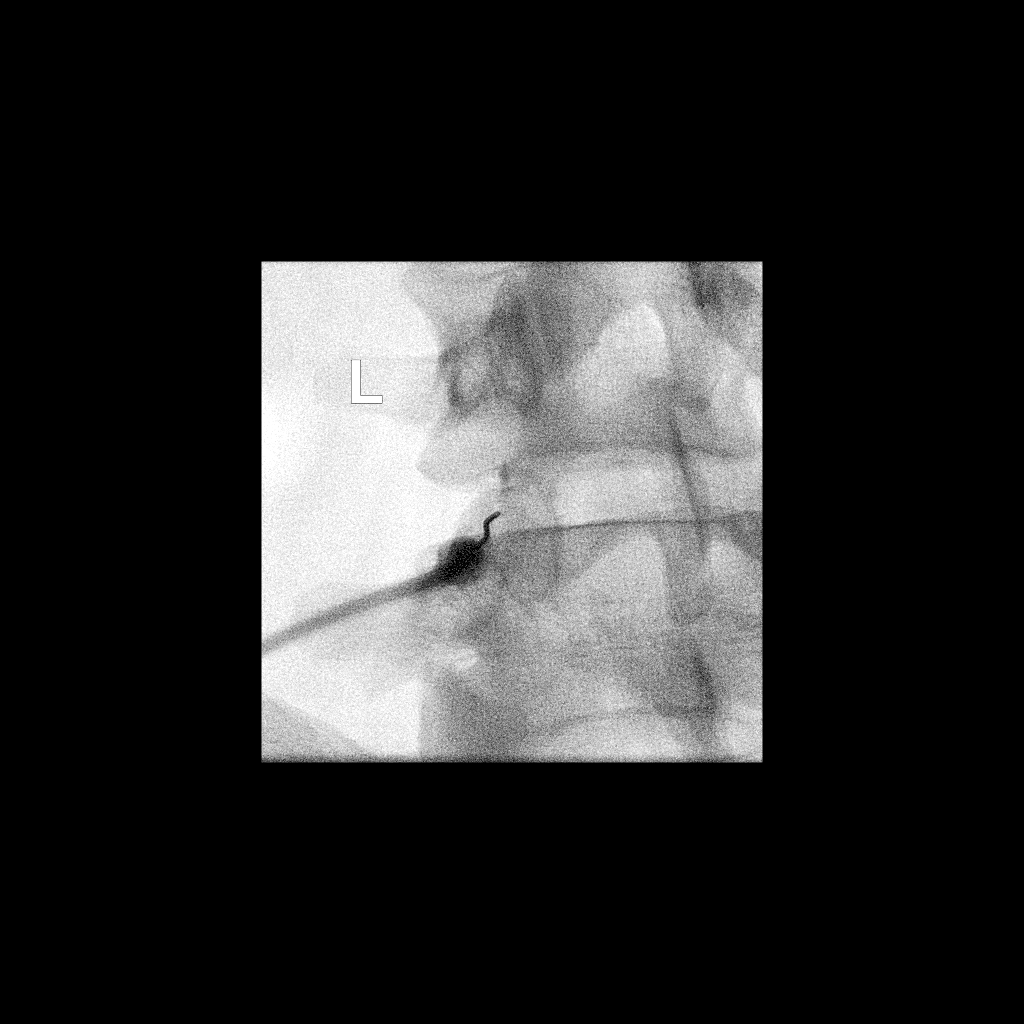

[Series 4: ortho standard · 1 of 1 slices shown (4 of 4)]
[im 1/1]
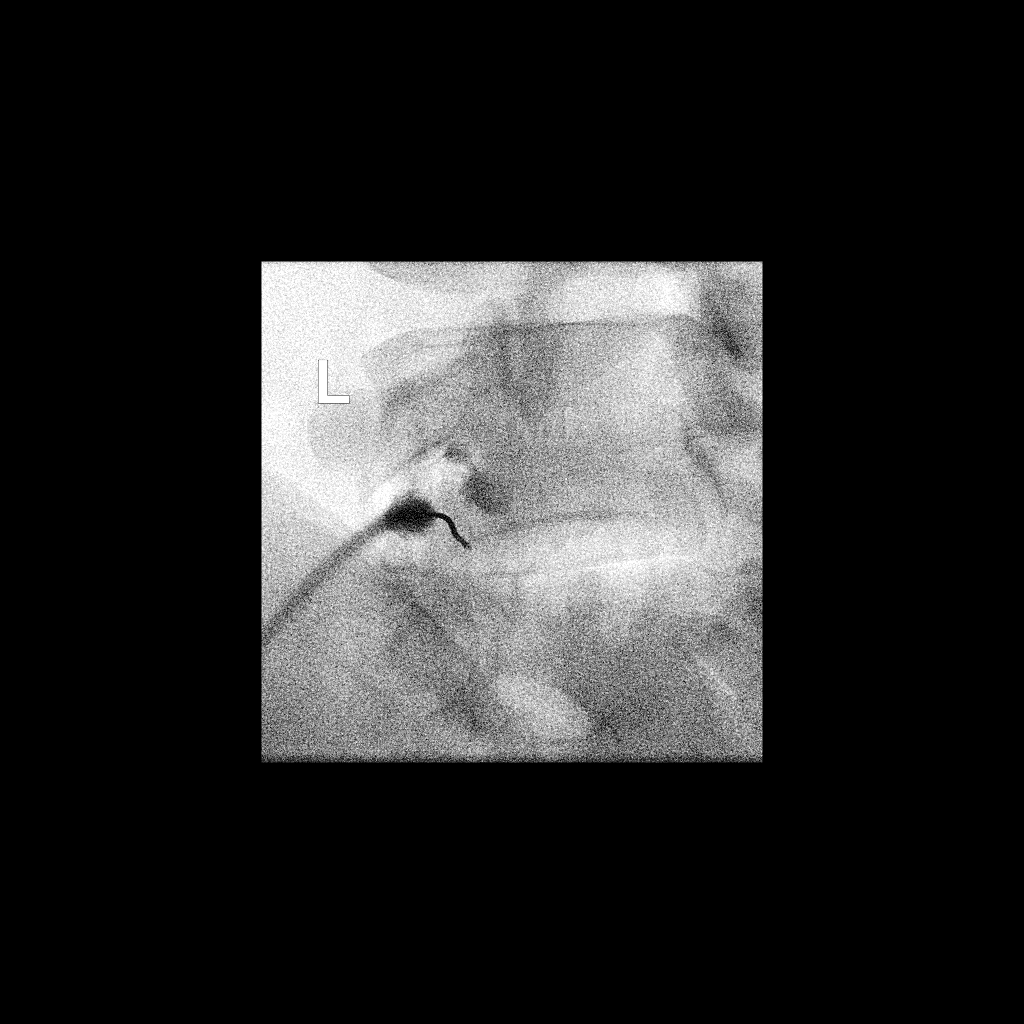

[4 of 4 positions shown; findings below may reference images not displayed]

FLUOROSCOPY TIME:  Radiation Exposure Index (as provided by the
fluoroscopic device): 35.40 uGy*m2

PROCEDURE:
DG FACET JT INJ L OR S SPINE SINGLE LEVEL UNILATERAL; DG FACET
INJECTION SECOND LEVEL RIGHT; DG FACET JT INJ L OR S SPINE SINGLE
LEVEL BILATERAL; DG FACET JT INJ L OR S SPINE SECOND LEVEL
UNILATERAL

The procedure, risks, benefits, and alternatives were explained to
the patient. Questions regarding the procedure were encouraged and
answered. The patient understands and consents to the procedure.

Right L5-[KU] INJECTION: A posterior oblique approach was taken
to the facet on the right at L5-S1 using a curved 22 gauge spinal
needle. Intra-articular positioning was confirmed by injecting a
small amount of Isovue-M 200. No vascular opacification is seen.
20mg of Depo-Medrol mixed with 0.25 mL 1% lidocaine were instilled
into the joint. The injection resulted in concordant pain. The
procedure was well-tolerated.

Right L4-[KU] INJECTION: A posterior oblique approach was taken to
the facet on the right at L4-5 using a curved 22 gauge spinal
needle. Intra-articular positioning was confirmed by injecting a
small amount of Isovue-M 200. No vascular opacification is seen.
40mg of Depo-Medrol mixed with 0.5 mL 1% lidocaine were instilled
into the joint. The injection resulted in concordant pain. The
procedure was well-tolerated.

Left L4-[KU] INJECTION: A posterior oblique approach was taken to
the facet on the left at L4-5 using a curved 22 gauge spinal needle.
Intra-articular positioning was confirmed by injecting a small
amount of Isovue-M 200. No vascular opacification is seen. 40mg of
Depo-Medrol mixed with 0.5 mL 1% lidocaine were instilled into the
joint. The injection resulted in concordant pain. The procedure was
well-tolerated.

Left L5-[KU] INJECTION: A posterior oblique approach was taken to
the facet on the right at L5-S1 using a curved 22 gauge spinal
needle. Intra-articular positioning was confirmed by injecting a
small amount of Isovue-M 200. No vascular opacification is seen.
20mg of Depo-Medrol mixed with 0.25 mL 1% lidocaine were instilled
into the joint. The injection resulted in concordant pain. The
procedure was well-tolerated.
IMPRESSION: Technically successful bilateral facet steroid injections at L4-5
and L5-S1 is detailed above.

## 2019-01-20 MED ORDER — METHYLPREDNISOLONE ACETATE 40 MG/ML INJ SUSP (RADIOLOG
120.0000 mg | Freq: Once | INTRAMUSCULAR | Status: AC
  Administered 2019-01-20: 09:00:00 120 mg via INTRA_ARTICULAR

## 2019-01-20 MED ORDER — IOPAMIDOL (ISOVUE-M 200) INJECTION 41%
1.0000 mL | Freq: Once | INTRAMUSCULAR | Status: AC
  Administered 2019-01-20: 1 mL via INTRA_ARTICULAR

## 2019-01-20 NOTE — Discharge Instructions (Signed)

## 2019-06-06 HISTORY — PX: OTHER SURGICAL HISTORY: SHX169

## 2021-07-01 ENCOUNTER — Encounter: Payer: Self-pay | Admitting: *Deleted

## 2021-07-01 ENCOUNTER — Other Ambulatory Visit: Payer: Self-pay | Admitting: *Deleted

## 2021-07-02 ENCOUNTER — Encounter: Payer: Self-pay | Admitting: Diagnostic Neuroimaging

## 2021-07-02 ENCOUNTER — Ambulatory Visit (INDEPENDENT_AMBULATORY_CARE_PROVIDER_SITE_OTHER): Payer: Commercial Managed Care - PPO | Admitting: Diagnostic Neuroimaging

## 2021-07-02 VITALS — BP 111/68 | HR 75 | Ht 63.0 in | Wt 122.3 lb

## 2021-07-02 DIAGNOSIS — M545 Low back pain, unspecified: Secondary | ICD-10-CM | POA: Diagnosis not present

## 2021-07-02 DIAGNOSIS — G8929 Other chronic pain: Secondary | ICD-10-CM | POA: Diagnosis not present

## 2021-07-02 NOTE — Progress Notes (Signed)
[facility]  PATIENT: [name] DOB: 1985/06/15  REFERRING CLINICIAN: Suella Broad, MD HISTORY FROM: patient REASON FOR VISIT: new consult   HISTORICAL  CHIEF COMPLAINT:  Chief Complaint  Patient presents with   Pain    Rm 6 New Pt  " LE weakness on left side, issue since hysterectomy"    HISTORY OF PRESENT ILLNESS:    36 year old female here for evaluation of left leg weakness.  04/10/2016 patient had robotic assisted hysterectomy.  Following this she developed diffuse low back pain radiating to the mid back into the bilateral hands.  She was evaluated by orthopedic clinic and symptoms managed conservatively.  She had MRI of the lumbar spine in 2020 and 2022 which have been unremarkable.  On recent evaluation some mild left leg weakness was noted and patient was referred here for further evaluation.  Patient stays very active at home and with physical exercise.  She does not notice any specific issues with her left leg.  She has no limitations with day-to-day activities.    REVIEW OF SYSTEMS: Full 14 system review of systems performed and negative with exception of: as per HPI.  ALLERGIES: Allergies  Allergen Reactions   Clindamycin Hives and Rash   Erythromycin Hives and Rash    All mycins drugs   Hydromorphone Shortness Of Breath    Other reaction(s): Other (See Comments) Patient will need to be on Oxygen Patient will need to be on Oxygen    Morphine And Related Anaphylaxis    Pt says she can take percocet w/o a problem   Doxycycline Rash and Nausea And Vomiting    Other reaction(s): vomiting   Naproxen     Other reaction(s): Other (See Comments) Constipation requiring laxatives   Shellfish Allergy Rash   Iodine Rash   Sulfonamide Derivatives Rash    HOME MEDICATIONS: Outpatient Medications Prior to Visit  Medication Sig Dispense Refill   acetaminophen (TYLENOL) 500 MG tablet Take 500 mg by mouth every 6 (six) hours as  needed.     busPIRone (BUSPAR) 7.5 MG tablet Take 7.5 mg by mouth 3 (three) times daily.     diazepam (VALIUM) 5 MG tablet Take 5 mg by mouth daily as needed for anxiety.     ibuprofen (ADVIL,MOTRIN) 200 MG tablet Take 800 mg by mouth every 6 (six) hours as needed for mild pain.     traMADol (ULTRAM) 50 MG tablet Take 100 mg by mouth in the morning, at noon, in the evening, and at bedtime.     omeprazole (PRILOSEC) 40 MG capsule Take 40 mg by mouth daily.     oxyCODONE-acetaminophen (PERCOCET/ROXICET) 5-325 MG tablet Take 1-2 tablets by mouth every 4 (four) hours as needed for severe pain. 40 tablet 0   No facility-administered medications prior to visit.    PAST MEDICAL HISTORY: Past Medical History:  Diagnosis Date   Abnormal Pap smear    Active labor - pitocin induction 03/09/2011   Anxiety    Complication of anesthesia    long time in recovery room to stabilize low blood pressure, low pulse, and low oxygen   Condyloma acuminatum    Cystitides, interstitial, chronic    Cystitis, interstitial    DDD (degenerative disc disease), lumbar    Follicular cyst of ovary    Galactorrhea not associated with childbirth    GERD (gastroesophageal reflux disease)    History of endometritis    Mild dysplasia of cervix    Other chronic cystitis  Papanicolaou smear of cervix with atypical squamous cells of undetermined significance (ASC-US)    Polycystic ovaries    Sinus tachycardia    mild    PAST SURGICAL HISTORY: Past Surgical History:  Procedure Laterality Date   ABDOMINAL HYSTERECTOMY     breast augmentation Bilateral 06/2019   CHOLECYSTECTOMY  2019   COLPOSCOPY     LAPAROSCOPIC TUBAL LIGATION Bilateral 04/05/2015   Procedure: LAPAROSCOPIC BILATERAL TUBAL LIGATION;  Surgeon: Brien Few, MD;  Location: [facility];  Service: Gynecology;  Laterality: Bilateral;   LAPAROSCOPY  2006   exploration fallopian tubes, bladder procedure   MYRINGOTOMY     ROBOTIC ASSISTED TOTAL  HYSTERECTOMY WITH SALPINGECTOMY Bilateral 04/10/2016   Procedure: ROBOTIC ASSISTED TOTAL HYSTERECTOMY WITH SALPINGECTOMY/ Excision of Endometriosis;  Surgeon: Brien Few, MD;  Location: [facility];  Service: Gynecology;  Laterality: Bilateral;   WISDOM TOOTH EXTRACTION      FAMILY HISTORY: Family History  Problem Relation Age of Onset   Anemia Mother    Depression Father    Stroke Maternal Grandmother    Hypertension Maternal Grandmother    Migraines Maternal Grandmother    Heart attack Maternal Grandmother    Diabetes Maternal Grandfather    Heart disease Maternal Grandfather    Diabetes Paternal Grandmother    COPD Paternal Grandfather    Autoimmune disease Paternal Grandfather        Howard Falter's Autoimmune Disease    SOCIAL HISTORY: Social History   Socioeconomic History   Marital status: Married    Spouse name: Ysidro Evert   Number of children: Not on file   Years of education: Not on file   Highest education level: Associate degree: occupational, Hotel manager, or vocational program  Occupational History   Not on file  Tobacco Use   Smoking status: Former    Packs/day: 1.00    Years: 17.00    Total pack years: 17.00    Types: Cigarettes   Smokeless tobacco: Never  Vaping Use   Vaping Use: Every day  Substance and Sexual Activity   Alcohol use: Yes    Comment: rarely   Drug use: No   Sexual activity: Yes    Birth control/protection: None  Other Topics Concern   Not on file  Social History Narrative   Lives with husband   Social Determinants of Health   Financial Resource Strain: Not on file  Food Insecurity: Not on file  Transportation Needs: Not on file  Physical Activity: Not on file  Stress: Not on file  Social Connections: Not on file  Intimate Partner Violence: Not on file     PHYSICAL EXAM  GENERAL EXAM/CONSTITUTIONAL: Vitals:  Vitals:   07/02/21 1049  BP: 111/68  Pulse: 75  Weight: 122 lb 4.8 oz (55.5 kg)  Height: '5\' 3"'$  (1.6 m)   Body  mass index is 21.66 kg/m. Wt Readings from Last 3 Encounters:  07/02/21 122 lb 4.8 oz (55.5 kg)  07/06/16 140 lb (63.5 kg)  04/10/16 142 lb (64.4 kg)   Patient is in no distress; well developed, nourished and groomed; neck is supple  CARDIOVASCULAR: Examination of carotid arteries is normal; no carotid bruits Regular rate and rhythm, no murmurs Examination of peripheral vascular system by observation and palpation is normal  EYES: Ophthalmoscopic exam of optic discs and posterior segments is normal; no papilledema or hemorrhages No results found.  MUSCULOSKELETAL: Gait, strength, tone, movements noted in Neurologic exam below  NEUROLOGIC: MENTAL STATUS:      No data to display  awake, alert, oriented to person, place and time recent and remote memory intact normal attention and concentration language fluent, comprehension intact, naming intact fund of knowledge appropriate  CRANIAL NERVE:  2nd - no papilledema on fundoscopic exam 2nd, 3rd, 4th, 6th - pupils equal and reactive to light, visual fields full to confrontation, extraocular muscles intact, no nystagmus 5th - facial sensation symmetric 7th - facial strength symmetric 8th - hearing intact 9th - palate elevates symmetrically, uvula midline 11th - shoulder shrug symmetric 12th - tongue protrusion midline  MOTOR:  normal bulk and tone, full strength in the BUE, BLE Bilateral FOOT INV, EVERSION, DF, PF 5/5; SUBTLE FLUCTUATING WEAKNESS IN LEFT FOOT DF, BUT NOT CONSISTENT  SENSORY:  normal and symmetric to light touch, pinprick, temperature, vibration  COORDINATION:  finger-nose-finger, fine finger movements normal  REFLEXES:  deep tendon reflexes present and symmetric  GAIT/STATION:  narrow based gait; able to walk on toes, heels      DIAGNOSTIC DATA (LABS, IMAGING, TESTING) - I reviewed patient records, labs, notes, testing and imaging myself where available.  Lab Results  Component  Value Date   WBC 10.4 04/11/2016   HGB 10.8 (L) 04/11/2016   HCT 32.3 (L) 04/11/2016   MCV 91.8 04/11/2016   PLT 186 04/11/2016      Component Value Date/Time   NA 138 04/11/2016 0537   K 3.9 04/11/2016 0537   CL 104 04/11/2016 0537   CO2 30 04/11/2016 0537   GLUCOSE 133 (H) 04/11/2016 0537   BUN 7 04/11/2016 0537   CREATININE 0.58 04/11/2016 0537   CALCIUM 8.4 (L) 04/11/2016 0537   PROT 7.9 11/09/2007 2029   ALBUMIN 4.8 11/09/2007 2029   AST 22 11/09/2007 2029   ALT 8 11/09/2007 2029   ALKPHOS 72 11/09/2007 2029   BILITOT 0.7 11/09/2007 2029   GFRNONAA >60 04/11/2016 0537   GFRAA >60 04/11/2016 0537   No results found for: "CHOL", "HDL", "LDLCALC", "LDLDIRECT", "TRIG", "CHOLHDL" No results found for: "HGBA1C" No results found for: "VITAMINB12" Lab Results  Component Value Date   TSH 1.31 11/06/2015    11/18/2020 MRI lumbar spine [I reviewed images myself and agree with interpretation. -[facility]]  -Minimal disc bulging and facet hypertrophy; no spinal stenosis or foraminal narrowing     ASSESSMENT AND PLAN  36 y.o. year old female here with:   Dx:  1. Chronic bilateral low back pain without sciatica     PLAN:  Low back pain (since hysterectomy in 2018) - continue supportive care  Left leg weakness  - neuro exam is essentially normal today; some very subtle, intermittent left foot dorsiflexion weakness noted, but not consistent; may represent normal R-L asymmetry vs remote peroneal nerve injury or other mild difference limited by left hip pain; this is not affecting daily activites or exercise; therefore recommend to monitor and continue supportive care / pain mgmt   Return for return to referring provider.    Penni Bombard, MD 5/85/2778, 2:42 PM Certified in Neurology, Neurophysiology and Neuroimaging  [facility] [address], [place], [place] [address] [phone]
